# Patient Record
Sex: Female | Born: 1960 | Race: Black or African American | Hispanic: No | Marital: Single | Smoking: Former smoker
Health system: Southern US, Community
[De-identification: ages and names within clinical notes are randomized; demographics above are authoritative.]

## PROBLEM LIST (undated history)

## (undated) DIAGNOSIS — K219 Gastro-esophageal reflux disease without esophagitis: Secondary | ICD-10-CM

---

## 2011-07-23 DIAGNOSIS — K219 Gastro-esophageal reflux disease without esophagitis: Secondary | ICD-10-CM

## 2011-07-23 HISTORY — DX: Gastro-esophageal reflux disease without esophagitis: K21.9

## 2011-10-21 DIAGNOSIS — T23269A Burn of second degree of back of unspecified hand, initial encounter: Secondary | ICD-10-CM | POA: Insufficient documentation

## 2013-01-12 DIAGNOSIS — R1084 Generalized abdominal pain: Secondary | ICD-10-CM | POA: Insufficient documentation

## 2013-01-12 DIAGNOSIS — R1310 Dysphagia, unspecified: Secondary | ICD-10-CM | POA: Insufficient documentation

## 2015-01-17 ENCOUNTER — Emergency Department: Payer: No Typology Code available for payment source

## 2015-01-17 ENCOUNTER — Encounter: Payer: Self-pay | Admitting: Emergency Medicine

## 2015-01-17 ENCOUNTER — Emergency Department
Admission: EM | Admit: 2015-01-17 | Discharge: 2015-01-17 | Disposition: A | Discharge status: Home / Self Care | Payer: No Typology Code available for payment source | Attending: Emergency Medicine | Admitting: Emergency Medicine

## 2015-01-17 DIAGNOSIS — Y998 Other external cause status: Secondary | ICD-10-CM | POA: Diagnosis not present

## 2015-01-17 DIAGNOSIS — S39012A Strain of muscle, fascia and tendon of lower back, initial encounter: Secondary | ICD-10-CM | POA: Insufficient documentation

## 2015-01-17 DIAGNOSIS — Y9389 Activity, other specified: Secondary | ICD-10-CM | POA: Insufficient documentation

## 2015-01-17 DIAGNOSIS — Y9289 Other specified places as the place of occurrence of the external cause: Secondary | ICD-10-CM | POA: Insufficient documentation

## 2015-01-17 DIAGNOSIS — S3992XA Unspecified injury of lower back, initial encounter: Secondary | ICD-10-CM | POA: Diagnosis present

## 2015-01-17 DIAGNOSIS — X58XXXA Exposure to other specified factors, initial encounter: Secondary | ICD-10-CM | POA: Diagnosis not present

## 2015-01-17 MED ORDER — IBUPROFEN 800 MG PO TABS: 800.0000 mg | ORAL_TABLET | Freq: Three times a day (TID) | ORAL | Status: DC | PRN

## 2015-01-17 MED ORDER — TRAMADOL HCL 50 MG PO TABS: 50.0000 mg | ORAL_TABLET | Freq: Four times a day (QID) | ORAL | Status: DC | PRN

## 2015-01-17 MED ORDER — CYCLOBENZAPRINE HCL 10 MG PO TABS: 10.0000 mg | ORAL_TABLET | Freq: Three times a day (TID) | ORAL | Status: DC | PRN

## 2015-01-17 MED ORDER — HYDROMORPHONE HCL 1 MG/ML IJ SOLN
1.0000 mg | Freq: Once | INTRAMUSCULAR | Status: AC
  Administered 2015-01-17: 1 mg via INTRAMUSCULAR
  Filled 2015-01-17: qty 1

## 2015-01-17 MED ORDER — ORPHENADRINE CITRATE 30 MG/ML IJ SOLN
60.0000 mg | Freq: Two times a day (BID) | INTRAMUSCULAR | Status: DC
  Administered 2015-01-17: 60 mg via INTRAMUSCULAR
  Filled 2015-01-17: qty 2

## 2015-01-17 MED ORDER — KETOROLAC TROMETHAMINE 60 MG/2ML IM SOLN
60.0000 mg | Freq: Once | INTRAMUSCULAR | Status: AC
  Administered 2015-01-17: 60 mg via INTRAMUSCULAR
  Filled 2015-01-17: qty 2

## 2015-01-17 NOTE — ED Notes (Signed)
Patient back from  X-ray 

## 2015-01-17 NOTE — ED Notes (Signed)
  Patient present to ED with complaint of lower back pain, reports pain began last night but had an increase in pain this today. Patient reports was moving things yesterday and believes pain might have come from that. Reports took a "muscle relaxer" is not sure of the name of medicine, without relief. Patient appears to be uncomfortable in bed. Patient reports feeling intermittent numbness in legs. Patient alert and oriented x 4, respirations even and unlabored. Family at bedside. Call bell within reach.

## 2015-01-17 NOTE — ED Provider Notes (Signed)
[facility] Emergency Department Provider Note  ____________________________________________  Time seen: Approximately 8:25 PM  I have reviewed the triage vital signs and the nursing notes.   HISTORY  Chief Complaint Back Pain    HPI Renee Gray is a 54 y.o. female complaining of low back pain secondary to extensive movement and lifting of furniture. Patient stated pain began last night has increased throughout the day. Patient states she took unknown muscle relaxer and noticed no relief. Patient stated intermitting numbness in her lower legs. Patient denies any bladder or bowel dysfunction.Patient is rating her pain as a 10 over 10. Patient has seen to be indicated because a Lane in the bed.   History reviewed. No pertinent past medical history.  There are no active problems to display for this patient.   History reviewed. No pertinent past surgical history.  Current Outpatient Rx  Name  Route  Sig  Dispense  Refill  . cyclobenzaprine (FLEXERIL) 10 MG tablet   Oral   Take 1 tablet (10 mg total) by mouth every 8 (eight) hours as needed for muscle spasms.   15 tablet   0   . ibuprofen (ADVIL,MOTRIN) 800 MG tablet   Oral   Take 1 tablet (800 mg total) by mouth every 8 (eight) hours as needed for moderate pain.   15 tablet   0   . traMADol (ULTRAM) 50 MG tablet   Oral   Take 1 tablet (50 mg total) by mouth every 6 (six) hours as needed for moderate pain.   12 tablet   0     Allergies Review of patient's allergies indicates no known allergies.  No family history on file.  Social History History  Substance Use Topics  . Smoking status: Never Smoker   . Smokeless tobacco: Not on file  . Alcohol Use: No    Review of Systems Constitutional: No fever/chills Eyes: No visual changes. ENT: No sore throat. Cardiovascular: Denies chest pain. Respiratory: Denies shortness of breath. Gastrointestinal: No abdominal pain.  No nausea, no  vomiting.  No diarrhea.  No constipation. Genitourinary: Negative for dysuria. Musculoskeletal: Positive for back pain. Skin: Negative for rash. Neurological: Negative for headaches, focal weakness or numbness.  10-point ROS otherwise negative.  ____________________________________________   PHYSICAL EXAM:  VITAL SIGNS: ED Triage Vitals  Enc Vitals Group     BP 01/17/15 2005 112/95 mmHg     Pulse Rate 01/17/15 2003 91     Resp 01/17/15 2003 22     Temp 01/17/15 2005 97.4 F (36.3 C)     Temp Source 01/17/15 2003 Oral     SpO2 01/17/15 2005 96 %     Weight 01/17/15 2003 250 lb (113.399 kg)     Height 01/17/15 2003 5\' 7"  (1.702 m)     Head Cir --      Peak Flow --      Pain Score 01/17/15 2003 10     Pain Loc --      Pain Edu? --      Excl. in GC? --     Constitutional: Alert and oriented. Well appearing and in no acute distress. Eyes: Conjunctivae are normal. PERRL. EOMI. Head: Atraumatic. Nose: No congestion/rhinnorhea. Mouth/Throat: Mucous membranes are moist.  Oropharynx non-erythematous. Neck: No stridor. No cervical spine tenderness to palpation. Hematological/Lymphatic/Immunilogical: No cervical lymphadenopathy. Cardiovascular: Normal rate, regular rhythm. Grossly normal heart sounds.  Good peripheral circulation. Respiratory: Normal respiratory effort.  No retractions. Lungs CTAB. Gastrointestinal: Soft and nontender.  No distention. No abdominal bruits. No CVA tenderness. Musculoskeletal: No obvious deformity of the low back decreased range of motion all fields. Patient has bilateral paraspinal muscle spasm with movement. Patient has some moderate guarding palpation L3-L5. Patient negative straight leg test. Neurologic:  Normal speech and language. No gross focal neurologic deficits are appreciated. No gait instability. Skin:  Skin is warm, dry and intact. No rash noted. Psychiatric: Mood and affect are normal. Speech and behavior are  normal.  ____________________________________________   LABS (all labs ordered are listed, but only abnormal results are displayed)  Labs Reviewed - No data to display ____________________________________________  EKG  ____________________________________________  RADIOLOGY  No acute findings on x-ray of the lumbar spine. I, Joni Reining, personally viewed and evaluated these images as part of my medical decision making.   ____________________________________________   PROCEDURES  Procedure(s) performed: None  Critical Care performed: No  ____________________________________________   INITIAL IMPRESSION / ASSESSMENT AND PLAN / ED COURSE  Pertinent labs & imaging results that were available during my care of the patient were reviewed by me and considered in my medical decision making (see chart for details). Acute low back pain. Patient stated moderate relief status post Dilaudid, Norflex, and Toradol. Patient given a prescription for tramadol Flexeril and ibuprofen. Patient [facility] or return back to the ER if condition worsens. ____________________________________________   FINAL CLINICAL IMPRESSION(S) / ED DIAGNOSES  Final diagnoses:  Lumbar strain, initial encounter      Joni Reining, PA-C 01/17/15 2236  Sharman Cheek, MD 01/19/15 [phone]

## 2015-01-17 NOTE — ED Notes (Signed)
Patient transported to X-ray 

## 2015-01-17 NOTE — ED Notes (Signed)
Pt reports "severe back pain" x3 days, denies any injury.

## 2015-12-01 ENCOUNTER — Emergency Department
Admission: EM | Admit: 2015-12-01 | Discharge: 2015-12-01 | Disposition: A | Discharge status: Home / Self Care | Payer: No Typology Code available for payment source | Attending: Emergency Medicine | Admitting: Emergency Medicine

## 2015-12-01 ENCOUNTER — Encounter: Payer: Self-pay | Admitting: Emergency Medicine

## 2015-12-01 DIAGNOSIS — K219 Gastro-esophageal reflux disease without esophagitis: Secondary | ICD-10-CM | POA: Insufficient documentation

## 2015-12-01 DIAGNOSIS — N390 Urinary tract infection, site not specified: Secondary | ICD-10-CM | POA: Insufficient documentation

## 2015-12-01 HISTORY — DX: Gastro-esophageal reflux disease without esophagitis: K21.9

## 2015-12-01 LAB — URINALYSIS COMPLETE WITH MICROSCOPIC (ARMC ONLY)
BILIRUBIN URINE: NEGATIVE
GLUCOSE, UA: NEGATIVE mg/dL
HGB URINE DIPSTICK: NEGATIVE
KETONES UR: NEGATIVE mg/dL
Leukocytes, UA: NEGATIVE
NITRITE: POSITIVE — AB
Protein, ur: NEGATIVE mg/dL
Specific Gravity, Urine: 1.016 (ref 1.005–1.030)
pH: 6 (ref 5.0–8.0)

## 2015-12-01 MED ORDER — SULFAMETHOXAZOLE-TRIMETHOPRIM 800-160 MG PO TABS: 1.0000 | ORAL_TABLET | Freq: Two times a day (BID) | ORAL | Status: DC

## 2015-12-01 MED ORDER — PHENAZOPYRIDINE HCL 100 MG PO TABS: 100.0000 mg | ORAL_TABLET | Freq: Three times a day (TID) | ORAL | Status: DC | PRN

## 2015-12-01 MED ORDER — RANITIDINE HCL 150 MG PO TABS: 150.0000 mg | ORAL_TABLET | Freq: Every day | ORAL | Status: DC

## 2015-12-01 NOTE — Discharge Instructions (Signed)

## 2015-12-01 NOTE — ED Provider Notes (Signed)
[facility] Emergency Department Provider Note  ____________________________________________  Time seen: Approximately 8:22 AM  I have reviewed the triage vital signs and the nursing notes.   HISTORY  Chief Complaint Back Pain    HPI Renee Gray is a 55 y.o. female , NAD, presents to the emergency department with several week history of dysuria and increased urinary frequency. Patient states over the last 4 days she's had some lower back pain which she believes is related to urinary tract infection. States she has had UTIs in the past but typically begin with dysuria and urinary frequency and as it worsens starts having lower back pain. Has not taken anything over-the-counter for current symptoms. Has not had any fevers, chills, body aches. Does have some lower abdominal pressure but no overt abdominal pain. Denies any pain in the back radiating to her abdomen. Has not had any chest pain, shortness breath, numbness, weakness, tingling. Denies saddle paresthesias. Also requests medication to treat her GERD. States she's been taking Nexium over-the-counter without any significant relief.   Past Medical History  Diagnosis Date  . GERD (gastroesophageal reflux disease)     There are no active problems to display for this patient.   History reviewed. No pertinent past surgical history.  Current Outpatient Rx  Name  Route  Sig  Dispense  Refill  . phenazopyridine (PYRIDIUM) 100 MG tablet   Oral   Take 1 tablet (100 mg total) by mouth 3 (three) times daily as needed for pain (May take 1-2 as needed three times daily).   20 tablet   0   . ranitidine (ZANTAC) 150 MG tablet   Oral   Take 1 tablet (150 mg total) by mouth at bedtime.   30 tablet   0   . sulfamethoxazole-trimethoprim (BACTRIM DS,SEPTRA DS) 800-160 MG tablet   Oral   Take 1 tablet by mouth 2 (two) times daily.   14 tablet   0     Allergies Review of patient's allergies indicates no  known allergies.  No family history on file.  Social History Social History  Substance Use Topics  . Smoking status: Never Smoker   . Smokeless tobacco: None  . Alcohol Use: No     Review of Systems  Constitutional: No fever/chills, Fatigue Eyes: No visual changes.  Cardiovascular: No chest pain, palpitations. Respiratory:  No shortness of breath. No wheezing.  Gastrointestinal: Positive lower abdominal pressure but no other abdominal pain.  No nausea, vomiting.  No diarrhea.  No constipation. Genitourinary: Positive for dysuria, increased urinary frequency. No hematuria. No urinary hesitancy, urgency. Musculoskeletal: Positive for lower back pain.  Skin: Negative for rash. Neurological: Negative for headaches, focal weakness or numbness. No saddle paresthesias 10-point ROS otherwise negative.  ____________________________________________   PHYSICAL EXAM:  VITAL SIGNS: ED Triage Vitals  Enc Vitals Group     BP 12/01/15 0758 141/87 mmHg     Pulse Rate 12/01/15 0758 74     Resp 12/01/15 0758 16     Temp 12/01/15 0758 98.2 F (36.8 C)     Temp Source 12/01/15 0758 Oral     SpO2 12/01/15 0758 94 %     Weight 12/01/15 0758 240 lb (108.863 kg)     Height 12/01/15 0758 5\' 7"  (1.702 m)     Head Cir --      Peak Flow --      Pain Score 12/01/15 0759 10     Pain Loc --  Pain Edu? --      Excl. in GC? --      Constitutional: Alert and oriented. Well appearing and in no acute distress. Eyes: Conjunctivae are normal.  Head: Atraumatic. Neck: Supple with full range of motion. Hematological/Lymphatic/Immunilogical: No cervical lymphadenopathy. Cardiovascular: Normal rate, regular rhythm. Normal S1 and S2.  Good peripheral circulation. Respiratory: Normal respiratory effort without tachypnea or retractions. Lungs CTAB with breath sounds noted in all lung fields. Gastrointestinal: Mild suprapubic tenderness but area is soft without distention or guarding. All other  quadrants are soft and nontender without distention or guarding. No CVA tenderness. Musculoskeletal: No lower extremity tenderness nor edema.  No joint effusions. Neurologic:  Normal speech and language. No gross focal neurologic deficits are appreciated. Gait and posture are normal Skin:  Skin is warm, dry and intact. No rash noted. Psychiatric: Mood and affect are normal. Speech and behavior are normal. Patient exhibits appropriate insight and judgement.   ____________________________________________   LABS (all labs ordered are listed, but only abnormal results are displayed)  Labs Reviewed  URINALYSIS COMPLETEWITH MICROSCOPIC ([facility] ONLY) - Abnormal; Notable for the following:    Color, Urine YELLOW (*)    APPearance CLOUDY (*)    Nitrite POSITIVE (*)    Bacteria, UA MANY (*)    Squamous Epithelial / LPF 0-5 (*)    All other components within normal limits   ____________________________________________  EKG  None ____________________________________________  RADIOLOGY  None ____________________________________________    PROCEDURES  Procedure(s) performed: None   Medications - No data to display   ____________________________________________   INITIAL IMPRESSION / ASSESSMENT AND PLAN / ED COURSE  Pertinent lab results that were available during my care of the patient were reviewed by me and considered in my medical decision making (see chart for details).  Patient's diagnosis is consistent with lower urinary tract infection and gastroesophageal reflux disease. Patient will be discharged home with prescriptions for Bactrim DS, Zantac, Pyridium to take as directed. Patient is to follow up with one of the local outpatient community clinics as listed in discharge instructions if symptoms persist past this treatment course. Patient is given ED precautions to return to the ED for any worsening or new symptoms.     ____________________________________________  FINAL CLINICAL IMPRESSION(S) / ED DIAGNOSES  Final diagnoses:  Lower urinary tract infection  Gastroesophageal reflux disease without esophagitis      NEW MEDICATIONS STARTED DURING THIS VISIT:  New Prescriptions   PHENAZOPYRIDINE (PYRIDIUM) 100 MG TABLET    Take 1 tablet (100 mg total) by mouth 3 (three) times daily as needed for pain (May take 1-2 as needed three times daily).   RANITIDINE (ZANTAC) 150 MG TABLET    Take 1 tablet (150 mg total) by mouth at bedtime.   SULFAMETHOXAZOLE-TRIMETHOPRIM (BACTRIM DS,SEPTRA DS) 800-160 MG TABLET    Take 1 tablet by mouth 2 (two) times daily.         Hope Pigeon, PA-C 12/01/15 1610  Sharyn Creamer, MD 12/01/15 [phone]

## 2015-12-01 NOTE — ED Notes (Signed)
States she is having lower back pain for couple of days   With pain with urination and bad odor

## 2015-12-01 NOTE — ED Notes (Signed)
Pt reports lower back pain x4 days, states "I probably got a UTI because my back always hurts like this"; pt denies any urinary symptoms.

## 2016-02-12 ENCOUNTER — Emergency Department
Admission: EM | Admit: 2016-02-12 | Discharge: 2016-02-12 | Disposition: A | Discharge status: Home / Self Care | Payer: BLUE CROSS/BLUE SHIELD | Attending: Emergency Medicine | Admitting: Emergency Medicine

## 2016-02-12 ENCOUNTER — Encounter: Payer: Self-pay | Admitting: Emergency Medicine

## 2016-02-12 DIAGNOSIS — R112 Nausea with vomiting, unspecified: Secondary | ICD-10-CM | POA: Diagnosis present

## 2016-02-12 DIAGNOSIS — Z5321 Procedure and treatment not carried out due to patient leaving prior to being seen by health care provider: Secondary | ICD-10-CM | POA: Diagnosis not present

## 2016-02-12 LAB — COMPREHENSIVE METABOLIC PANEL
ALBUMIN: 4.2 g/dL (ref 3.5–5.0)
ALT: 17 U/L (ref 14–54)
AST: 22 U/L (ref 15–41)
Alkaline Phosphatase: 104 U/L (ref 38–126)
Anion gap: 4 — ABNORMAL LOW (ref 5–15)
BUN: 15 mg/dL (ref 6–20)
CO2: 32 mmol/L (ref 22–32)
Calcium: 9.4 mg/dL (ref 8.9–10.3)
Chloride: 107 mmol/L (ref 101–111)
Creatinine, Ser: 0.84 mg/dL (ref 0.44–1.00)
GFR calc Af Amer: >60 mL/min (ref >60)
GFR calc non Af Amer: >60 mL/min (ref >60)
GLUCOSE: 100 mg/dL — AB (ref 65–99)
POTASSIUM: 3.5 mmol/L (ref 3.5–5.1)
Sodium: 143 mmol/L (ref 135–145)
TOTAL PROTEIN: 8.1 g/dL (ref 6.5–8.1)
Total Bilirubin: 0.4 mg/dL (ref 0.3–1.2)

## 2016-02-12 LAB — URINALYSIS COMPLETE WITH MICROSCOPIC (ARMC ONLY)
BILIRUBIN URINE: NEGATIVE
Glucose, UA: NEGATIVE mg/dL
Hgb urine dipstick: NEGATIVE
Ketones, ur: NEGATIVE mg/dL
NITRITE: POSITIVE — AB
Protein, ur: 30 mg/dL — AB
Specific Gravity, Urine: 1.015 (ref 1.005–1.030)
pH: 9 — ABNORMAL HIGH (ref 5.0–8.0)

## 2016-02-12 LAB — LIPASE, BLOOD: Lipase: 61 U/L — ABNORMAL HIGH (ref 11–51)

## 2016-02-12 LAB — CBC
HEMATOCRIT: 27.7 % — AB (ref 35.0–47.0)
Hemoglobin: 8.7 g/dL — ABNORMAL LOW (ref 12.0–16.0)
MCH: 23.2 pg — AB (ref 26.0–34.0)
MCHC: 31.3 g/dL — AB (ref 32.0–36.0)
MCV: 74 fL — ABNORMAL LOW (ref 80.0–100.0)
Platelets: 277 10*3/uL (ref 150–440)
RBC: 3.75 MIL/uL — ABNORMAL LOW (ref 3.80–5.20)
RDW: 19.2 % — AB (ref 11.5–14.5)
WBC: 8.3 10*3/uL (ref 3.6–11.0)

## 2016-02-12 MED ORDER — ONDANSETRON 4 MG PO TBDP
4.0000 mg | ORAL_TABLET | Freq: Once | ORAL | Status: AC | PRN
  Administered 2016-02-12: 4 mg via ORAL

## 2016-02-12 MED ORDER — ONDANSETRON 4 MG PO TBDP
ORAL_TABLET | ORAL | Status: AC
  Filled 2016-02-12: qty 1

## 2016-02-12 NOTE — ED Notes (Signed)
No answer when called from lobby and outside 

## 2016-02-12 NOTE — ED Triage Notes (Signed)
Pt comes in to ED w/ c/o abdominal pain, Nausea,vomitting x2days. Pt states cannot hold anything down. Pt AOx4.

## 2016-02-12 NOTE — ED Notes (Signed)
Lab called regarding blood draw sent at 7pm, results not received yet; st doing now

## 2016-02-12 NOTE — ED Notes (Signed)
No answer when called several times from lobby 

## 2016-05-29 ENCOUNTER — Encounter: Payer: Self-pay | Admitting: Emergency Medicine

## 2016-05-29 ENCOUNTER — Emergency Department
Admission: EM | Admit: 2016-05-29 | Discharge: 2016-05-29 | Disposition: A | Discharge status: Home / Self Care | Payer: BLUE CROSS/BLUE SHIELD | Attending: Emergency Medicine | Admitting: Emergency Medicine

## 2016-05-29 DIAGNOSIS — K219 Gastro-esophageal reflux disease without esophagitis: Secondary | ICD-10-CM

## 2016-05-29 DIAGNOSIS — N390 Urinary tract infection, site not specified: Secondary | ICD-10-CM

## 2016-05-29 DIAGNOSIS — R1030 Lower abdominal pain, unspecified: Secondary | ICD-10-CM | POA: Diagnosis present

## 2016-05-29 LAB — CBC
HEMATOCRIT: 29.8 % — AB (ref 35.0–47.0)
HEMOGLOBIN: 9.5 g/dL — AB (ref 12.0–16.0)
MCH: 24.6 pg — AB (ref 26.0–34.0)
MCHC: 31.9 g/dL — AB (ref 32.0–36.0)
MCV: 77.1 fL — ABNORMAL LOW (ref 80.0–100.0)
Platelets: 236 10*3/uL (ref 150–440)
RBC: 3.87 MIL/uL (ref 3.80–5.20)
RDW: 20.3 % — ABNORMAL HIGH (ref 11.5–14.5)
WBC: 6.5 10*3/uL (ref 3.6–11.0)

## 2016-05-29 LAB — COMPREHENSIVE METABOLIC PANEL
ALBUMIN: 3.9 g/dL (ref 3.5–5.0)
ALT: 19 U/L (ref 14–54)
ANION GAP: 7 (ref 5–15)
AST: 23 U/L (ref 15–41)
Alkaline Phosphatase: 95 U/L (ref 38–126)
BUN: 15 mg/dL (ref 6–20)
CO2: 27 mmol/L (ref 22–32)
Calcium: 9.2 mg/dL (ref 8.9–10.3)
Chloride: 106 mmol/L (ref 101–111)
Creatinine, Ser: 0.75 mg/dL (ref 0.44–1.00)
GFR calc Af Amer: >60 mL/min (ref >60)
GFR calc non Af Amer: >60 mL/min (ref >60)
GLUCOSE: 101 mg/dL — AB (ref 65–99)
POTASSIUM: 3.5 mmol/L (ref 3.5–5.1)
Sodium: 140 mmol/L (ref 135–145)
Total Bilirubin: 0.7 mg/dL (ref 0.3–1.2)
Total Protein: 7.6 g/dL (ref 6.5–8.1)

## 2016-05-29 LAB — URINALYSIS, COMPLETE (UACMP) WITH MICROSCOPIC
Bilirubin Urine: NEGATIVE
GLUCOSE, UA: NEGATIVE mg/dL
Hgb urine dipstick: NEGATIVE
KETONES UR: NEGATIVE mg/dL
Nitrite: POSITIVE — AB
Protein, ur: NEGATIVE mg/dL
Specific Gravity, Urine: 1.023 (ref 1.005–1.030)
pH: 7 (ref 5.0–8.0)

## 2016-05-29 LAB — LIPASE, BLOOD: Lipase: 35 U/L (ref 11–51)

## 2016-05-29 MED ORDER — ONDANSETRON 4 MG PO TBDP: 4.0000 mg | ORAL_TABLET | Freq: Three times a day (TID) | ORAL | 0 refills | Status: DC | PRN

## 2016-05-29 MED ORDER — SULFAMETHOXAZOLE-TRIMETHOPRIM 800-160 MG PO TABS
1.0000 | ORAL_TABLET | Freq: Once | ORAL | Status: AC
  Administered 2016-05-29: 1 via ORAL
  Filled 2016-05-29: qty 1

## 2016-05-29 MED ORDER — PANTOPRAZOLE SODIUM 40 MG PO TBEC: 40.0000 mg | DELAYED_RELEASE_TABLET | Freq: Every day | ORAL | 0 refills | Status: DC

## 2016-05-29 MED ORDER — SULFAMETHOXAZOLE-TRIMETHOPRIM 800-160 MG PO TABS: 1.0000 | ORAL_TABLET | Freq: Two times a day (BID) | ORAL | 0 refills | Status: DC

## 2016-05-29 MED ORDER — ONDANSETRON 4 MG PO TBDP
4.0000 mg | ORAL_TABLET | Freq: Once | ORAL | Status: AC | PRN
Start: 2016-05-29 — End: 2016-05-29
  Administered 2016-05-29: 4 mg via ORAL
  Filled 2016-05-29: qty 1

## 2016-05-29 MED ORDER — PHENAZOPYRIDINE HCL 200 MG PO TABS: 200.0000 mg | ORAL_TABLET | Freq: Three times a day (TID) | ORAL | 0 refills | Status: DC | PRN

## 2016-05-29 NOTE — ED Triage Notes (Signed)
Pt ambulatory to triage in nad, reports lower abd pain n/v x 1 week.  Pt reports hx GERD but out of medication.

## 2016-05-29 NOTE — Discharge Instructions (Signed)
1. Take antibiotic as prescribed (Septra DS twice daily 7 days). 2. You may take nausea medicine as needed (Zofran #20). 3. Take Pyridium as prescribed for urinary discomfort. 4. Start Protonix 40 mg daily to reduce symptoms of acid reflux. 5. Return to the ER for worsening symptoms, persistent vomiting, difficulty breathing or other concerns.

## 2016-05-29 NOTE — ED Provider Notes (Signed)
[facility] Emergency Department Provider Note   ____________________________________________   First MD Initiated Contact with Patient 05/29/16 2319     (approximate)  I have reviewed the triage vital signs and the nursing notes.   HISTORY  Chief Complaint Abdominal Pain    HPI Renee Gray is a 55 y.o. female who presents to the ED from home with a chief complaint of abdominal pain.Patient reports a one-week history of dysuria, suprapubic abdominal pain, occasional nausea and vomiting. Also reports she is out of medication for her GERD. Reports similar symptoms previously. Denies associated fever, chills, chest pain, shortness of breath, diarrhea. Denies recent travel or trauma. Nothing makes her symptoms better or worse.   Past Medical History:  Diagnosis Date  . GERD (gastroesophageal reflux disease)     There are no active problems to display for this patient.   History reviewed. No pertinent surgical history.  Prior to Admission medications   Medication Sig Start Date End Date Taking? Authorizing Provider  phenazopyridine (PYRIDIUM) 100 MG tablet Take 1 tablet (100 mg total) by mouth 3 (three) times daily as needed for pain (May take 1-2 as needed three times daily). 12/01/15   Jami L Hagler, PA-C  ranitidine (ZANTAC) 150 MG tablet Take 1 tablet (150 mg total) by mouth at bedtime. 12/01/15 12/31/15  Jami L Hagler, PA-C  sulfamethoxazole-trimethoprim (BACTRIM DS,SEPTRA DS) 800-160 MG tablet Take 1 tablet by mouth 2 (two) times daily. 12/01/15   Jami L Hagler, PA-C    Allergies Patient has no known allergies.  History reviewed. No pertinent family history.  Social History Social History  Substance Use Topics  . Smoking status: Never Smoker  . Smokeless tobacco: Never Used  . Alcohol use No    Review of Systems  Constitutional: No fever/chills. Eyes: No visual changes. ENT: No sore throat. Cardiovascular: Denies chest  pain. Respiratory: Denies shortness of breath. Gastrointestinal: Positive for abdominal pain.  Positive for occasional nausea and vomiting.  No diarrhea.  No constipation. Genitourinary: Positive for dysuria. Musculoskeletal: Negative for back pain. Skin: Negative for rash. Neurological: Negative for headaches, focal weakness or numbness.  10-point ROS otherwise negative.  ____________________________________________   PHYSICAL EXAM:  VITAL SIGNS: ED Triage Vitals  Enc Vitals Group     BP 05/29/16 2037 (!) 144/84     Pulse Rate 05/29/16 2037 85     Resp 05/29/16 2037 18     Temp 05/29/16 2037 97.6 F (36.4 C)     Temp Source 05/29/16 2037 Oral     SpO2 05/29/16 2037 95 %     Weight 05/29/16 2037 240 lb (108.9 kg)     Height 05/29/16 2037 5\' 7"  (1.702 m)     Head Circumference --      Peak Flow --      Pain Score 05/29/16 2038 10     Pain Loc --      Pain Edu? --      Excl. in GC? --     Constitutional: Alert and oriented. Well appearing and in no acute distress. Eyes: Conjunctivae are normal. PERRL. EOMI. Head: Atraumatic. Nose: No congestion/rhinnorhea. Mouth/Throat: Mucous membranes are moist.  Oropharynx non-erythematous. Neck: No stridor.   Cardiovascular: Normal rate, regular rhythm. Grossly normal heart sounds.  Good peripheral circulation. Respiratory: Normal respiratory effort.  No retractions. Lungs CTAB. Gastrointestinal: Obese. Soft and mildly tender to palpation suprapubic area without rebound or guarding. No distention. No abdominal bruits. No CVA tenderness. Musculoskeletal: No lower extremity  tenderness nor edema.  No joint effusions. Neurologic:  Normal speech and language. No gross focal neurologic deficits are appreciated. No gait instability. Skin:  Skin is warm, dry and intact. No rash noted. Psychiatric: Mood and affect are normal. Speech and behavior are normal.  ____________________________________________   LABS (all labs ordered are listed,  but only abnormal results are displayed)  Labs Reviewed  COMPREHENSIVE METABOLIC PANEL - Abnormal; Notable for the following:       Result Value   Glucose, Bld 101 (*)    All other components within normal limits  CBC - Abnormal; Notable for the following:    Hemoglobin 9.5 (*)    HCT 29.8 (*)    MCV 77.1 (*)    MCH 24.6 (*)    MCHC 31.9 (*)    RDW 20.3 (*)    All other components within normal limits  URINALYSIS, COMPLETE (UACMP) WITH MICROSCOPIC - Abnormal; Notable for the following:    Color, Urine YELLOW (*)    APPearance HAZY (*)    Nitrite POSITIVE (*)    Leukocytes, UA SMALL (*)    Bacteria, UA MANY (*)    Squamous Epithelial / LPF 6-30 (*)    All other components within normal limits  LIPASE, BLOOD   ____________________________________________  EKG  None  ____________________________________________  RADIOLOGY  None ____________________________________________   PROCEDURES  Procedure(s) performed: None  Procedures  Critical Care performed: No  ____________________________________________   INITIAL IMPRESSION / ASSESSMENT AND PLAN / ED COURSE  Pertinent labs & imaging results that were available during my care of the patient were reviewed by me and considered in my medical decision making (see chart for details).  55 year old female who presents with lower abdominal pain, dysuria, occasional nausea/vomiting. Received Zofran in triage and feels much better. Tolerated PO without emesis. Laboratory results demonstrate stable anemia; urinalysis suggestive of UTI. Will treat with antibiotic, Pyridium, antiemetic as needed. Will refill GERD medication patient will follow-up with her PCP next week. Strict return precautions given. Patient verbalizes understanding and agrees with plan of care.  Clinical Course      ____________________________________________   FINAL CLINICAL IMPRESSION(S) / ED DIAGNOSES  Final diagnoses:  Lower abdominal pain  Lower  urinary tract infectious disease      NEW MEDICATIONS STARTED DURING THIS VISIT:  New Prescriptions   No medications on file     Note:  This document was prepared using Dragon voice recognition software and may include unintentional dictation errors.    Irean HongJade J Ronan Dion, MD 05/30/16 [phone]

## 2016-09-30 DIAGNOSIS — Z0000 Encounter for general adult medical examination without abnormal findings: Secondary | ICD-10-CM | POA: Insufficient documentation

## 2016-09-30 DIAGNOSIS — G8929 Other chronic pain: Secondary | ICD-10-CM | POA: Insufficient documentation

## 2016-09-30 DIAGNOSIS — R7303 Prediabetes: Secondary | ICD-10-CM | POA: Insufficient documentation

## 2016-09-30 DIAGNOSIS — R519 Headache, unspecified: Secondary | ICD-10-CM

## 2016-09-30 DIAGNOSIS — Z1239 Encounter for other screening for malignant neoplasm of breast: Secondary | ICD-10-CM | POA: Insufficient documentation

## 2016-09-30 HISTORY — DX: Headache, unspecified: R51.9

## 2016-09-30 HISTORY — DX: Encounter for general adult medical examination without abnormal findings: Z00.00

## 2016-10-05 DIAGNOSIS — R140 Abdominal distension (gaseous): Secondary | ICD-10-CM | POA: Insufficient documentation

## 2016-10-05 DIAGNOSIS — R112 Nausea with vomiting, unspecified: Secondary | ICD-10-CM

## 2016-10-05 DIAGNOSIS — K5901 Slow transit constipation: Secondary | ICD-10-CM | POA: Insufficient documentation

## 2016-10-05 HISTORY — DX: Abdominal distension (gaseous): R14.0

## 2016-10-05 HISTORY — DX: Nausea with vomiting, unspecified: R11.2

## 2016-10-28 ENCOUNTER — Emergency Department
Admission: EM | Admit: 2016-10-28 | Discharge: 2016-10-28 | Disposition: A | Discharge status: Home / Self Care | Payer: BLUE CROSS/BLUE SHIELD | Attending: Emergency Medicine | Admitting: Emergency Medicine

## 2016-10-28 ENCOUNTER — Encounter: Payer: Self-pay | Admitting: Emergency Medicine

## 2016-10-28 ENCOUNTER — Emergency Department: Payer: BLUE CROSS/BLUE SHIELD

## 2016-10-28 DIAGNOSIS — R109 Unspecified abdominal pain: Secondary | ICD-10-CM | POA: Diagnosis not present

## 2016-10-28 DIAGNOSIS — Z79899 Other long term (current) drug therapy: Secondary | ICD-10-CM | POA: Insufficient documentation

## 2016-10-28 DIAGNOSIS — R300 Dysuria: Secondary | ICD-10-CM | POA: Diagnosis not present

## 2016-10-28 LAB — CBC WITH DIFFERENTIAL/PLATELET
BASOS ABS: 0 10*3/uL (ref 0–0.1)
BASOS PCT: 0 %
EOS ABS: 0.1 10*3/uL (ref 0–0.7)
EOS PCT: 2 %
HCT: 32.7 % — ABNORMAL LOW (ref 35.0–47.0)
Hemoglobin: 10.4 g/dL — ABNORMAL LOW (ref 12.0–16.0)
Lymphocytes Relative: 27 %
Lymphs Abs: 1.6 10*3/uL (ref 1.0–3.6)
MCH: 26.4 pg (ref 26.0–34.0)
MCHC: 31.9 g/dL — AB (ref 32.0–36.0)
MCV: 82.6 fL (ref 80.0–100.0)
MONO ABS: 0.6 10*3/uL (ref 0.2–0.9)
MONOS PCT: 10 %
Neutro Abs: 3.7 10*3/uL (ref 1.4–6.5)
Neutrophils Relative %: 61 %
PLATELETS: 222 10*3/uL (ref 150–440)
RBC: 3.96 MIL/uL (ref 3.80–5.20)
RDW: 16.5 % — AB (ref 11.5–14.5)
WBC: 6 10*3/uL (ref 3.6–11.0)

## 2016-10-28 LAB — BASIC METABOLIC PANEL
Anion gap: 8 (ref 5–15)
BUN: 10 mg/dL (ref 6–20)
CALCIUM: 8.8 mg/dL — AB (ref 8.9–10.3)
CO2: 26 mmol/L (ref 22–32)
CREATININE: 0.74 mg/dL (ref 0.44–1.00)
Chloride: 106 mmol/L (ref 101–111)
GFR calc non Af Amer: >60 mL/min (ref >60)
Glucose, Bld: 83 mg/dL (ref 65–99)
Potassium: 3.3 mmol/L — ABNORMAL LOW (ref 3.5–5.1)
SODIUM: 140 mmol/L (ref 135–145)

## 2016-10-28 LAB — URINALYSIS, ROUTINE W REFLEX MICROSCOPIC
BILIRUBIN URINE: NEGATIVE
Glucose, UA: NEGATIVE mg/dL
Hgb urine dipstick: NEGATIVE
KETONES UR: NEGATIVE mg/dL
Nitrite: NEGATIVE
Protein, ur: NEGATIVE mg/dL
SPECIFIC GRAVITY, URINE: 1.016 (ref 1.005–1.030)
pH: 7 (ref 5.0–8.0)

## 2016-10-28 MED ORDER — MORPHINE SULFATE (PF) 4 MG/ML IV SOLN
4.0000 mg | Freq: Once | INTRAVENOUS | Status: AC
  Administered 2016-10-28: 4 mg via INTRAVENOUS
  Filled 2016-10-28: qty 1

## 2016-10-28 MED ORDER — PHENAZOPYRIDINE HCL 95 MG PO TABS: 190.0000 mg | ORAL_TABLET | Freq: Three times a day (TID) | ORAL | 0 refills | Status: DC | PRN

## 2016-10-28 MED ORDER — SODIUM CHLORIDE 0.9 % IV BOLUS (SEPSIS)
1000.0000 mL | INTRAVENOUS | Status: AC
  Administered 2016-10-28: 1000 mL via INTRAVENOUS

## 2016-10-28 MED ORDER — ONDANSETRON HCL 4 MG/2ML IJ SOLN
4.0000 mg | INTRAMUSCULAR | Status: AC
  Administered 2016-10-28: 4 mg via INTRAVENOUS
  Filled 2016-10-28: qty 2

## 2016-10-28 MED ORDER — KETOROLAC TROMETHAMINE 30 MG/ML IJ SOLN
15.0000 mg | Freq: Once | INTRAMUSCULAR | Status: AC
  Administered 2016-10-28: 15 mg via INTRAVENOUS
  Filled 2016-10-28: qty 1

## 2016-10-28 MED ORDER — CEPHALEXIN 500 MG PO CAPS
500.0000 mg | ORAL_CAPSULE | Freq: Once | ORAL | Status: AC
  Administered 2016-10-28: 500 mg via ORAL
  Filled 2016-10-28: qty 1

## 2016-10-28 MED ORDER — CEPHALEXIN 500 MG PO CAPS: 500.0000 mg | ORAL_CAPSULE | Freq: Two times a day (BID) | ORAL | 0 refills | Status: DC

## 2016-10-28 NOTE — ED Provider Notes (Signed)
[facility] Emergency Department Provider Note  ____________________________________________   First MD Initiated Contact with Patient 10/28/16 1848     (approximate)  I have reviewed the triage vital signs and the nursing notes.   HISTORY  Chief Complaint Flank Pain and Dysuria    HPI Renee Gray is a 56 y.o. female Who denies any chronic medical issues but states she has had a number of bladder infections in the past who presents for evaluation of gradually worsening burning pain when she urinates and right-sided flank pain.  She states that the symptoms started mild but are now severe and worse with any kind of movement and with urination.  Nothing makes the symptoms better.  She denies fever/chills, chest pain, shortness of breath, nausea, vomiting, diarrhea, vaginal bleeding.  She has had kidney stones in the past.  She has had no numbness or tingling in any of her extremities. She has no abdominal pain.   Past Medical History:  Diagnosis Date  . GERD (gastroesophageal reflux disease)     There are no active problems to display for this patient.   No past surgical history on file.  Prior to Admission medications   Medication Sig Start Date End Date Taking? Authorizing Provider  Biotin 5000 MCG TABS Take 5,000 mcg by mouth daily.   Yes [provider]  omeprazole (PRILOSEC) 20 MG capsule Take 20 mg by mouth 2 (two) times daily before a meal.   Yes [provider]  cephALEXin (KEFLEX) 500 MG capsule Take 1 capsule (500 mg total) by mouth 2 (two) times daily. 10/28/16   Loleta Rose, MD  ondansetron (ZOFRAN ODT) 4 MG disintegrating tablet Take 1 tablet (4 mg total) by mouth every 8 (eight) hours as needed for nausea or vomiting. Patient not taking: Reported on 10/28/2016 05/29/16   Irean Hong, MD  pantoprazole (PROTONIX) 40 MG tablet Take 1 tablet (40 mg total) by mouth daily. Patient not taking: Reported on 10/28/2016  05/29/16   Irean Hong, MD  phenazopyridine (PYRIDIUM) 95 MG tablet Take 2 tablets (190 mg total) by mouth 3 (three) times daily with meals as needed for pain (bladder pain/spasms). 10/28/16   Loleta Rose, MD  ranitidine (ZANTAC) 150 MG tablet Take 1 tablet (150 mg total) by mouth at bedtime. Patient not taking: Reported on 10/28/2016 12/01/15 12/31/15  Hagler, Clearnce Hasten L, PA-C    Allergies Patient has no known allergies.  No family history on file.  Social History Social History  Substance Use Topics  . Smoking status: Never Smoker  . Smokeless tobacco: Never Used  . Alcohol use No    Review of Systems Constitutional: No fever/chills Eyes: No visual changes. ENT: No sore throat. Cardiovascular: Denies chest pain. Respiratory: Denies shortness of breath. Gastrointestinal: No abdominal pain.  right-sided flank pain. No nausea, no vomiting.  No diarrhea.  No constipation. Genitourinary: +dysuria. Musculoskeletal: Negative for neck pain.  Negative for back pain. Integumentary: Negative for rash. Neurological: Negative for headaches, focal weakness or numbness.   ____________________________________________   PHYSICAL EXAM:  VITAL SIGNS: ED Triage Vitals  Enc Vitals Group     BP 10/28/16 1808 (!) 162/93     Pulse Rate 10/28/16 1808 94     Resp 10/28/16 1808 18     Temp 10/28/16 1808 98 F (36.7 C)     Temp Source 10/28/16 1808 Oral     SpO2 10/28/16 1808 93 %     Weight 10/28/16 1809 250 lb (113.4  kg)     Height 10/28/16 1809 5\' 7"  (1.702 m)     Head Circumference --      Peak Flow --      Pain Score 10/28/16 1808 10     Pain Loc --      Pain Edu? --      Excl. in GC? --     Constitutional: Alert and oriented. Well appearing and in no acute distress though she does appear uncomfortable Eyes: Conjunctivae are normal.  Head: Atraumatic. Nose: No congestion/rhinnorhea. Mouth/Throat: Mucous membranes are moist. Neck: No stridor.  No meningeal signs.   Cardiovascular:  Normal rate, regular rhythm. Good peripheral circulation. Grossly normal heart sounds. Respiratory: Normal respiratory effort.  No retractions. Lungs CTAB. Gastrointestinal: Soft and nontender. No distention.  +right CVA tenderness Genitourinary: deferred Musculoskeletal: No lower extremity tenderness nor edema. No gross deformities of extremities. Neurologic:  Normal speech and language. No gross focal neurologic deficits are appreciated.  Skin:  Skin is warm, dry and intact. No rash noted. Psychiatric: Mood and affect are normal. Speech and behavior are normal.  ____________________________________________   LABS (all labs ordered are listed, but only abnormal results are displayed)  Labs Reviewed  URINALYSIS, ROUTINE W REFLEX MICROSCOPIC - Abnormal; Notable for the following:       Result Value   Color, Urine YELLOW (*)    APPearance CLOUDY (*)    Leukocytes, UA TRACE (*)    Bacteria, UA RARE (*)    Squamous Epithelial / LPF TOO NUMEROUS TO COUNT (*)    All other components within normal limits  CBC WITH DIFFERENTIAL/PLATELET - Abnormal; Notable for the following:    Hemoglobin 10.4 (*)    HCT 32.7 (*)    MCHC 31.9 (*)    RDW 16.5 (*)    All other components within normal limits  BASIC METABOLIC PANEL - Abnormal; Notable for the following:    Potassium 3.3 (*)    Calcium 8.8 (*)    All other components within normal limits  URINE CULTURE   ____________________________________________  EKG  None - EKG not ordered by ED physician ____________________________________________  RADIOLOGY   Ct Renal Stone Study  Result Date: 10/28/2016 CLINICAL DATA:  Bilateral flank pain worse on the right radiating to the groin. Dysuria. EXAM: CT ABDOMEN AND PELVIS WITHOUT CONTRAST TECHNIQUE: Multidetector CT imaging of the abdomen and pelvis was performed following the standard protocol without IV contrast. COMPARISON:  None. FINDINGS: Lower chest: Moderate sized hiatal hernia with  atelectasis at the lung bases. Normal size cardiac chambers without pericardial effusion. Hepatobiliary: No focal liver abnormality is seen. Status post cholecystectomy. No biliary dilatation. Pancreas: Unremarkable. No pancreatic ductal dilatation or surrounding inflammatory changes. Spleen: Normal in size without focal abnormality. Adrenals/Urinary Tract: Adrenal glands are unremarkable. Kidneys are normal, without renal calculi, focal lesion, or hydronephrosis. Bladder is unremarkable. Stomach/Bowel: Scattered colonic diverticulosis. No bowel obstruction or inflammation. Normal-appearing appendix. Vascular/Lymphatic: No significant vascular findings are present. No enlarged abdominal or pelvic lymph nodes. Reproductive: Uterus and bilateral adnexa are unremarkable. Other: No abdominal wall hernia or abnormality. No abdominopelvic ascites. Musculoskeletal: Mild degenerative disc disease along the visualized thoracolumbar spine from approximately T7 through L1 without acute nor suspicious osseous abnormalities. Minimal anterolisthesis of L4 on L5. Disc space narrowing is also noted at L5-S1. No acute nor suspicious osseous abnormalities. IMPRESSION: 1. No nephrolithiasis or evidence of obstructive uropathy. The bladder is contracted without mural thickening. 2. Scattered colonic diverticulosis without bowel obstruction or inflammation. 3. Status  post cholecystectomy. Electronically Signed   By: Tollie Eth M.D.   On: 10/28/2016 20:44    ____________________________________________   PROCEDURES  Critical Care performed: No   Procedure(s) performed:   Procedures   ____________________________________________   INITIAL IMPRESSION / ASSESSMENT AND PLAN / ED COURSE  Pertinent labs & imaging results that were available during my care of the patient were reviewed by me and considered in my medical decision making (see chart for details).  the patient most likely has a urinary tract infection or  possibly pyelonephritis but she does appear quite uncomfortable.  She feels currently unable to urinate.  I will start an IV and give her IV fluids and check basic labs to look for signs of systemic infection as well as kidney function.  I anticipate I will also obtain a CT scan to make sure she does not have an obstructive/infected stone.   Clinical Course as of Oct 29 116  Wed Oct 28, 2016  2047 reassuring CT renal stone protocol CT Renal Stone Study [CF]  2141 UA minimally indicative of UTI, but will treat empirically.  Sending urine culture.  Updated patient who states she is ready to go home.  I will give first dose keflex here since pharmacy is closed.  [CF]    Clinical Course User Index [CF] Loleta Rose, MD    ____________________________________________  FINAL CLINICAL IMPRESSION(S) / ED DIAGNOSES  Final diagnoses:  Dysuria     MEDICATIONS GIVEN DURING THIS VISIT:  Medications  sodium chloride 0.9 % bolus 1,000 mL (0 mLs Intravenous Stopped 10/28/16 2205)  morphine 4 MG/ML injection 4 mg (4 mg Intravenous Given 10/28/16 1916)  ondansetron (ZOFRAN) injection 4 mg (4 mg Intravenous Given 10/28/16 1916)  cephALEXin (KEFLEX) capsule 500 mg (500 mg Oral Given 10/28/16 2156)  ketorolac (TORADOL) 30 MG/ML injection 15 mg (15 mg Intravenous Given 10/28/16 2156)     NEW OUTPATIENT MEDICATIONS STARTED DURING THIS VISIT:  Discharge Medication List as of 10/28/2016  9:44 PM    START taking these medications   Details  cephALEXin (KEFLEX) 500 MG capsule Take 1 capsule (500 mg total) by mouth 2 (two) times daily., Starting Wed 10/28/2016, Print        Discharge Medication List as of 10/28/2016  9:44 PM    CONTINUE these medications which have CHANGED   Details  phenazopyridine (PYRIDIUM) 95 MG tablet Take 2 tablets (190 mg total) by mouth 3 (three) times daily with meals as needed for pain (bladder pain/spasms)., Starting Wed 10/28/2016, Print        Discharge Medication List  as of 10/28/2016  9:44 PM       Note:  This document was prepared using Dragon voice recognition software and may include unintentional dictation errors.    Loleta Rose, MD 10/29/16 [phone]

## 2016-10-28 NOTE — ED Notes (Signed)
Pt unable to void at this time. 

## 2016-10-28 NOTE — Discharge Instructions (Signed)

## 2016-10-28 NOTE — ED Notes (Signed)
Patient transported to CT 

## 2016-10-28 NOTE — ED Notes (Signed)
Pt has n/v   meds given   md aware

## 2016-10-28 NOTE — ED Notes (Addendum)
pt has right side back pain.  No abd pain.  Pt reports dysuria.  No n/v/d.  Sx for 2 days.  Hx kidney stones. No vag bleeding.  Pt reports vag discharge.   Pt alert.  Speech clear.

## 2016-10-28 NOTE — ED Triage Notes (Signed)
Pt reports bilateral flank pain, worse on the right that radiates to groin. Pt denies NVD. Pt appears uncomfortable in triage. Pt reports burning upon urination.

## 2016-10-30 LAB — URINE CULTURE: SPECIAL REQUESTS: NORMAL

## 2016-11-11 DIAGNOSIS — D649 Anemia, unspecified: Secondary | ICD-10-CM | POA: Insufficient documentation

## 2017-04-19 ENCOUNTER — Emergency Department
Admission: EM | Admit: 2017-04-19 | Discharge: 2017-04-19 | Disposition: A | Discharge status: Home / Self Care | Payer: BLUE CROSS/BLUE SHIELD | Attending: Emergency Medicine | Admitting: Emergency Medicine

## 2017-04-19 ENCOUNTER — Encounter: Payer: Self-pay | Admitting: Emergency Medicine

## 2017-04-19 ENCOUNTER — Other Ambulatory Visit: Payer: Self-pay

## 2017-04-19 DIAGNOSIS — Z5321 Procedure and treatment not carried out due to patient leaving prior to being seen by health care provider: Secondary | ICD-10-CM | POA: Insufficient documentation

## 2017-04-19 DIAGNOSIS — M545 Low back pain: Secondary | ICD-10-CM | POA: Diagnosis present

## 2017-04-19 NOTE — ED Provider Notes (Cosign Needed)
Patient was triaged, placed in room.  Patient eloped prior to seeing provider.  She left without informing staff or provider.  No assessment or treatment provided.   Racheal PatchesCuthriell, [place] Sigmund D, PA-C 04/19/17 1607

## 2017-04-19 NOTE — ED Notes (Signed)
Patient was not in the room when PA went in several times. Other staff also states patient was not in the room.

## 2017-04-19 NOTE — ED Triage Notes (Signed)
Pt reports low back pain for two days. Denies NVD, abdominal pain or fever at home. Ambulatory to triage with slow but steady gait.

## 2017-07-21 DIAGNOSIS — Z2821 Immunization not carried out because of patient refusal: Secondary | ICD-10-CM

## 2017-07-21 DIAGNOSIS — M545 Low back pain, unspecified: Secondary | ICD-10-CM

## 2017-07-21 HISTORY — DX: Low back pain, unspecified: M54.50

## 2017-07-21 HISTORY — DX: Immunization not carried out because of patient refusal: Z28.21

## 2017-07-22 ENCOUNTER — Emergency Department
Admission: EM | Admit: 2017-07-22 | Discharge: 2017-07-22 | Disposition: A | Discharge status: Home / Self Care | Payer: BLUE CROSS/BLUE SHIELD | Attending: Emergency Medicine | Admitting: Emergency Medicine

## 2017-07-22 ENCOUNTER — Encounter: Payer: Self-pay | Admitting: Emergency Medicine

## 2017-07-22 ENCOUNTER — Emergency Department: Payer: BLUE CROSS/BLUE SHIELD

## 2017-07-22 ENCOUNTER — Other Ambulatory Visit: Payer: Self-pay

## 2017-07-22 DIAGNOSIS — Z79899 Other long term (current) drug therapy: Secondary | ICD-10-CM | POA: Diagnosis not present

## 2017-07-22 DIAGNOSIS — N3090 Cystitis, unspecified without hematuria: Secondary | ICD-10-CM | POA: Insufficient documentation

## 2017-07-22 DIAGNOSIS — M545 Low back pain: Secondary | ICD-10-CM | POA: Diagnosis present

## 2017-07-22 LAB — URINALYSIS, COMPLETE (UACMP) WITH MICROSCOPIC
BACTERIA UA: NONE SEEN
BILIRUBIN URINE: NEGATIVE
Glucose, UA: NEGATIVE mg/dL
Hgb urine dipstick: NEGATIVE
KETONES UR: NEGATIVE mg/dL
NITRITE: NEGATIVE
Protein, ur: NEGATIVE mg/dL
Specific Gravity, Urine: 1.013 (ref 1.005–1.030)
pH: 7 (ref 5.0–8.0)

## 2017-07-22 MED ORDER — CEPHALEXIN 500 MG PO CAPS: 500.0000 mg | ORAL_CAPSULE | Freq: Two times a day (BID) | ORAL | 0 refills | Status: AC

## 2017-07-22 MED ORDER — KETOROLAC TROMETHAMINE 30 MG/ML IJ SOLN
30.0000 mg | Freq: Once | INTRAMUSCULAR | Status: AC
  Administered 2017-07-22: 30 mg via INTRAMUSCULAR
  Filled 2017-07-22: qty 1

## 2017-07-22 MED ORDER — LIDOCAINE 5 % EX PTCH
1.0000 | MEDICATED_PATCH | CUTANEOUS | Status: DC
  Administered 2017-07-22: 1 via TRANSDERMAL
  Filled 2017-07-22: qty 1

## 2017-07-22 NOTE — ED Notes (Signed)
Patient transported to X-ray 

## 2017-07-22 NOTE — ED Provider Notes (Signed)
[facility] Emergency Department Provider Note  ____________________________________________  Time seen: Approximately 7:44 AM  I have reviewed the triage vital signs and the nursing notes.   HISTORY  Chief Complaint Back Pain    HPI Renee Gray is a 57 y.o. female that presents to the emergency department for evaluation of low back pain for 4 days.  Back pain starts through her entire low back and radiates into her right hip.  Patient went to her PCP yesterday and was diagnosed with a lumbar strain.  She was given a prescription of Flexeril that is not helping.  She is urinating more frequently than usual.  She states that symptoms feel like a urinary tract infection. 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  No specific trauma.  No bowel or bladder dysfunction or saddle paresthesias.  No nausea, vomiting, abdominal pain numbness, tingling.   Past Medical History:  Diagnosis Date  . GERD (gastroesophageal reflux disease)     There are no active problems to display for this patient.   History reviewed. No pertinent surgical history.  Prior to Admission medications   Medication Sig Start Date End Date Taking? Authorizing Provider  Biotin 5000 MCG TABS Take 5,000 mcg by mouth daily.    [provider]  cephALEXin (KEFLEX) 500 MG capsule Take 1 capsule (500 mg total) by mouth 2 (two) times daily for 10 days. 07/22/17 08/01/17  Enid DerryWagner, Eureka Valdes, PA-C  omeprazole (PRILOSEC) 20 MG capsule Take 20 mg by mouth 2 (two) times daily before a meal.    [provider]  ondansetron (ZOFRAN ODT) 4 MG disintegrating tablet Take 1 tablet (4 mg total) by mouth every 8 (eight) hours as needed for nausea or vomiting. Patient not taking: Reported on 10/28/2016 05/29/16   Irean HongSung, Jade J, MD  pantoprazole (PROTONIX) 40 MG tablet Take 1 tablet (40 mg total) by mouth daily. Patient not taking: Reported on 10/28/2016 05/29/16   Irean HongSung, Jade  J, MD  phenazopyridine (PYRIDIUM) 95 MG tablet Take 2 tablets (190 mg total) by mouth 3 (three) times daily with meals as needed for pain (bladder pain/spasms). 10/28/16   Loleta RoseForbach, Cory, MD  ranitidine (ZANTAC) 150 MG tablet Take 1 tablet (150 mg total) by mouth at bedtime. Patient not taking: Reported on 10/28/2016 12/01/15 12/31/15  Hagler, Clearnce HastenJami L, PA-C    Allergies Patient has no known allergies.  No family history on file.  Social History Social History   Tobacco Use  . Smoking status: Never Smoker  . Smokeless tobacco: Never Used  Substance Use Topics  . Alcohol use: No  . Drug use: No     Review of Systems  Constitutional: No fever/chills Cardiovascular: No chest pain. Respiratory: No SOB. Gastrointestinal: No abdominal pain.  No nausea, no vomiting.  Musculoskeletal: Positive for back pain.  Skin: Negative for rash, abrasions, lacerations, ecchymosis. Neurological: Negative for headaches, numbness or tingling   ____________________________________________   PHYSICAL EXAM:  VITAL SIGNS: ED Triage Vitals  Enc Vitals Group     BP 07/22/17 0628 135/77     Pulse Rate 07/22/17 0628 84     Resp 07/22/17 0628 20     Temp 07/22/17 0628 97.9 F (36.6 C)     Temp Source 07/22/17 0628 Oral     SpO2 07/22/17 0628 97 %     Weight 07/22/17 0627 260 lb (117.9 kg)     Height 07/22/17 0627 5\' 7"  (1.702 m)  Head Circumference --      Peak Flow --      Pain Score 07/22/17 0627 10     Pain Loc --      Pain Edu? --      Excl. in GC? --      Constitutional: Alert and oriented. Well appearing and in no acute distress. Eyes: Conjunctivae are normal. PERRL. EOMI. Head: Atraumatic. ENT:      Ears:      Nose: No congestion/rhinnorhea.      Mouth/Throat: Mucous membranes are moist.  Neck: No stridor.   Cardiovascular: Normal rate, regular rhythm.  Good peripheral circulation. Respiratory: Normal respiratory effort without tachypnea or retractions. Lungs CTAB. Good air  entry to the bases with no decreased or absent breath sounds. Gastrointestinal: Bowel sounds 4 quadrants. Soft and nontender to palpation. No guarding or rigidity. No palpable masses. No distention. No CVA tenderness. Musculoskeletal: Full range of motion to all extremities. No gross deformities appreciated.  Tenderness to palpation throughout lumbar muscles.  No tenderness to palpation over lumbar spine.  Strength 5 out of 5 in lower extremities bilaterally.  Normal gait. Neurologic:  Normal speech and language. No gross focal neurologic deficits are appreciated.  Skin:  Skin is warm, dry and intact. No rash noted.   ____________________________________________   LABS (all labs ordered are listed, but only abnormal results are displayed)  Labs Reviewed  URINALYSIS, COMPLETE (UACMP) WITH MICROSCOPIC - Abnormal; Notable for the following components:      Result Value   Color, Urine YELLOW (*)    APPearance HAZY (*)    Leukocytes, UA LARGE (*)    Squamous Epithelial / LPF 6-30 (*)    All other components within normal limits   ____________________________________________  EKG   ____________________________________________  RADIOLOGY Lexine Baton, personally viewed and evaluated these images (plain radiographs) as part of my medical decision making, as well as reviewing the written report by the radiologist.  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 Mild anterolisthesis of L4 on L5 by 3 mm. No acute loss vertebral body height and disc height. No pars fracture. IMPRESSION: 1. No acute findings lumbar spine. 2. Mild grade 1 anterolisthesis of L4 on L5. 3. No pars fracture  identified. Electronically Signed   By: Genevive Bi M.D.   On: 07/22/2017 08:38    ____________________________________________    PROCEDURES  Procedure(s) performed:    Procedures    Medications  ketorolac (TORADOL) 30 MG/ML injection 30 mg (30 mg Intramuscular Given 07/22/17 0820)     ____________________________________________   INITIAL IMPRESSION / ASSESSMENT AND PLAN / ED COURSE  Pertinent labs & imaging results that were available during my care of the patient were reviewed by me and considered in my medical decision making (see chart for details).  Review of the [place] CSRS was performed in accordance of the [facility] prior to dispensing any controlled drugs.   Patient's diagnosis is consistent with urinary tract infection.  Vital signs and exam are reassuring.  X-ray negative for acute bony abnormalities.  Results of x-ray were discussed with patient.  She was given IM Toradol in ED and felt better after medication.  Urinalysis consistent with infection.  Patient will be discharged home with prescriptions for Keflex.  She will continue taking Flexeril and ibuprofen from PCP yesterday.  Patient is to follow up with PCP as directed. Patient is given ED precautions to return to the ED for any worsening or new symptoms.     ____________________________________________  FINAL CLINICAL IMPRESSION(S) / ED DIAGNOSES  Final diagnoses:  Cystitis      NEW MEDICATIONS STARTED DURING THIS VISIT:  ED Discharge Orders        Ordered    cephALEXin (KEFLEX) 500 MG capsule  2 times daily     07/22/17 2440          This chart was dictated using voice recognition software/Dragon. Despite best efforts to proofread, errors can occur which can change the meaning. Any change was purely unintentional.    Enid Derry, PA-C 07/22/17 1526    Phineas Semen, MD 07/23/17 [phone]

## 2017-07-22 NOTE — ED Triage Notes (Addendum)
Pt to triage via w/c with no distress noted; pt c/o lower back pain radiating down right leg; [facility] and "stuff they gave me made it worser"; st pain began after pickup up heavy item last week

## 2017-12-08 DIAGNOSIS — R350 Frequency of micturition: Secondary | ICD-10-CM | POA: Insufficient documentation

## 2017-12-08 DIAGNOSIS — Z23 Encounter for immunization: Secondary | ICD-10-CM

## 2017-12-08 HISTORY — DX: Frequency of micturition: R35.0

## 2017-12-08 HISTORY — DX: Encounter for immunization: Z23

## 2018-01-17 ENCOUNTER — Emergency Department: Payer: BLUE CROSS/BLUE SHIELD

## 2018-01-17 ENCOUNTER — Emergency Department
Admission: EM | Admit: 2018-01-17 | Discharge: 2018-01-17 | Disposition: A | Discharge status: Home / Self Care | Payer: BLUE CROSS/BLUE SHIELD | Attending: Emergency Medicine | Admitting: Emergency Medicine

## 2018-01-17 ENCOUNTER — Other Ambulatory Visit: Payer: Self-pay

## 2018-01-17 ENCOUNTER — Encounter: Payer: Self-pay | Admitting: Emergency Medicine

## 2018-01-17 DIAGNOSIS — Y92009 Unspecified place in unspecified non-institutional (private) residence as the place of occurrence of the external cause: Secondary | ICD-10-CM | POA: Insufficient documentation

## 2018-01-17 DIAGNOSIS — S46002A Unspecified injury of muscle(s) and tendon(s) of the rotator cuff of left shoulder, initial encounter: Secondary | ICD-10-CM | POA: Insufficient documentation

## 2018-01-17 DIAGNOSIS — X500XXA Overexertion from strenuous movement or load, initial encounter: Secondary | ICD-10-CM | POA: Insufficient documentation

## 2018-01-17 DIAGNOSIS — Y93F2 Activity, caregiving, lifting: Secondary | ICD-10-CM | POA: Diagnosis not present

## 2018-01-17 DIAGNOSIS — Y990 Civilian activity done for income or pay: Secondary | ICD-10-CM | POA: Insufficient documentation

## 2018-01-17 DIAGNOSIS — S46912A Strain of unspecified muscle, fascia and tendon at shoulder and upper arm level, left arm, initial encounter: Secondary | ICD-10-CM | POA: Insufficient documentation

## 2018-01-17 DIAGNOSIS — Z79899 Other long term (current) drug therapy: Secondary | ICD-10-CM | POA: Insufficient documentation

## 2018-01-17 DIAGNOSIS — S4992XA Unspecified injury of left shoulder and upper arm, initial encounter: Secondary | ICD-10-CM | POA: Diagnosis present

## 2018-01-17 MED ORDER — BACLOFEN 10 MG PO TABS: 10.0000 mg | ORAL_TABLET | Freq: Every day | ORAL | 1 refills | Status: AC

## 2018-01-17 MED ORDER — MELOXICAM 15 MG PO TABS: 15.0000 mg | ORAL_TABLET | Freq: Every day | ORAL | 2 refills | Status: AC

## 2018-01-17 NOTE — Discharge Instructions (Signed)
Follow-up with your regular doctor Dr. Rosita KeaMenz.  Please call for an appointment if you are not better in 3 to 5 days.  Wear the sling for 3 days.  Use medication as prescribed.  Apply ice for at least 30 minutes 3 times daily.  Return if worsening

## 2018-01-17 NOTE — ED Provider Notes (Signed)
[facility] Emergency Department Provider Note  ____________________________________________   First MD Initiated Contact with Patient 01/17/18 1445     (approximate)  I have reviewed the triage vital signs and the nursing notes.   HISTORY  Chief Complaint Arm Injury    HPI Renee Gray is a 57 y.o. female presents to the emergency department complaining of left shoulder pain.  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  She denies numbness or tingling.  States she cannot raise her arm over her head or out to the side.  Trace.    Past Medical History:  Diagnosis Date  . GERD (gastroesophageal reflux disease)   . GERD (gastroesophageal reflux disease)     There are no active problems to display for this patient.   History reviewed. No pertinent surgical history.  Prior to Admission medications   Medication Sig Start Date End Date Taking? Authorizing Provider  baclofen (LIORESAL) 10 MG tablet Take 1 tablet (10 mg total) by mouth daily. 01/17/18 01/17/19  Faythe Ghee, PA-C  Biotin 5000 MCG TABS Take 5,000 mcg by mouth daily.    [provider]  meloxicam (MOBIC) 15 MG tablet Take 1 tablet (15 mg total) by mouth daily. 01/17/18 01/17/19  Fisher, Roselyn Bering, PA-C  omeprazole (PRILOSEC) 20 MG capsule Take 20 mg by mouth 2 (two) times daily before a meal.    [provider]  phenazopyridine (PYRIDIUM) 95 MG tablet Take 2 tablets (190 mg total) by mouth 3 (three) times daily with meals as needed for pain (bladder pain/spasms). 10/28/16   Loleta Rose, MD    Allergies Patient has no known allergies.  History reviewed. No pertinent family history.  Social History Social History   Tobacco Use  . Smoking status: Never Smoker  . Smokeless tobacco: Never Used  Substance Use Topics  . Alcohol use: No  . Drug use:  No    Review of Systems  Constitutional: No fever/chills Eyes: No visual changes. ENT: No sore throat. Respiratory: Denies cough Genitourinary: Negative for dysuria. Musculoskeletal: Negative for back pain.  Positive left shoulder pain Skin: Negative for rash.    ____________________________________________   PHYSICAL EXAM:  VITAL SIGNS: ED Triage Vitals  Enc Vitals Group     BP 01/17/18 1413 (!) 161/87     Pulse Rate 01/17/18 1413 86     Resp --      Temp 01/17/18 1413 98.2 F (36.8 C)     Temp Source 01/17/18 1413 Oral     SpO2 01/17/18 1413 100 %     Weight 01/17/18 1414 240 lb (108.9 kg)     Height 01/17/18 1414 5\' 8"  (1.727 m)     Head Circumference --      Peak Flow --      Pain Score 01/17/18 1413 10     Pain Loc --      Pain Edu? --      Excl. in GC? --     Constitutional: Alert and oriented. Well appearing and in no acute distress. Eyes: Conjunctivae are normal.  Head: Atraumatic. Nose: No congestion/rhinnorhea. Mouth/Throat: Mucous membranes are moist.   Neck:  supple no lymphadenopathy noted Cardiovascular: Normal rate, regular rhythm. Heart sounds are normal Respiratory: Normal respiratory effort.  No retractions, lungs c t a  GU: deferred Musculoskeletal: Decreased range of motion of  the left shoulder.  The patient is unable to reach overhead and to the side.  The left shoulder, bursa, and deltoid area are tender to palpation.  The anterior is slightly tender to palpation.  Grips are equal bilaterally and she is neurovascularly intact. Neurologic:  Normal speech and language.  Skin:  Skin is warm, dry and intact. No rash noted. Psychiatric: Mood and affect are normal. Speech and behavior are normal.  ____________________________________________   LABS (all labs ordered are listed, but only abnormal results are displayed)  Labs Reviewed - No data to  display ____________________________________________   ____________________________________________  RADIOLOGY  X-ray of the left shoulder is negative for any acute abnormality  ____________________________________________   PROCEDURES  Procedure(s) performed: Sling was applied by nursing staff  Procedures    ____________________________________________   INITIAL IMPRESSION / ASSESSMENT AND PLAN / ED COURSE  Pertinent labs & imaging results that were available during my care of the patient were reviewed by me and considered in my medical decision making (see chart for details).   Patient is a 57 year old female complains of left shoulder pain after pulling on a patient last night.  On physical exam patient has decreased range of motion of the left shoulder.  She is unable to reach overhead and to the side.  Grips are equal bilaterally and she is neurovascularly intact.  X-ray of the left shoulder is negative  Discussed the findings with patient.  She was given a sling.  She is to apply ice to the area for 30 minutes 3 times a day.  She is given a prescription for meloxicam and baclofen.  She was given light duty for work as she should not be pulling or lifting for 1 week.  She states she understands will comply with our instructions.  She was discharged in stable condition     As part of my medical decision making, I reviewed the following data within the electronic MEDICAL RECORD NUMBER Nursing notes reviewed and incorporated, Old chart reviewed, Radiograph reviewed x-ray left shoulder is negative, Notes from prior ED visits and [place] Controlled Substance Database  ____________________________________________   FINAL CLINICAL IMPRESSION(S) / ED DIAGNOSES  Final diagnoses:  Left shoulder strain, initial encounter  Rotator cuff injury, left, initial encounter      NEW MEDICATIONS STARTED DURING THIS VISIT:  Discharge Medication List as of 01/17/2018  2:57 PM    START  taking these medications   Details  baclofen (LIORESAL) 10 MG tablet Take 1 tablet (10 mg total) by mouth daily., Starting Mon 01/17/2018, Until Tue 01/17/2019, Normal    meloxicam (MOBIC) 15 MG tablet Take 1 tablet (15 mg total) by mouth daily., Starting Mon 01/17/2018, Until Tue 01/17/2019, Normal         Note:  This document was prepared using Dragon voice recognition software and may include unintentional dictation errors.    Faythe GheeFisher, Susan W, PA-C 01/17/18 1558    Jene EveryKinner, Robert, MD 01/17/18 [phone]

## 2018-01-17 NOTE — ED Notes (Signed)
See triage note  Presents with pain to left shoulder ..states pain radiates under arm and down her lateral rib area  States pain started after lifting  No deformity noted

## 2018-01-17 NOTE — ED Triage Notes (Signed)
Pt with left shoulder pain that started yesterday after pulling pt up. Pt able to move arm but buming pain 10/10

## 2018-01-18 DIAGNOSIS — I10 Essential (primary) hypertension: Secondary | ICD-10-CM | POA: Insufficient documentation

## 2018-07-09 ENCOUNTER — Encounter: Payer: Self-pay | Admitting: *Deleted

## 2018-07-09 ENCOUNTER — Other Ambulatory Visit: Payer: Self-pay

## 2018-07-09 ENCOUNTER — Emergency Department
Admission: EM | Admit: 2018-07-09 | Discharge: 2018-07-09 | Disposition: A | Discharge status: Home / Self Care | Payer: BLUE CROSS/BLUE SHIELD | Attending: Emergency Medicine | Admitting: Emergency Medicine

## 2018-07-09 DIAGNOSIS — Y92009 Unspecified place in unspecified non-institutional (private) residence as the place of occurrence of the external cause: Secondary | ICD-10-CM | POA: Diagnosis not present

## 2018-07-09 DIAGNOSIS — Y990 Civilian activity done for income or pay: Secondary | ICD-10-CM | POA: Insufficient documentation

## 2018-07-09 DIAGNOSIS — Z79899 Other long term (current) drug therapy: Secondary | ICD-10-CM | POA: Diagnosis not present

## 2018-07-09 DIAGNOSIS — X500XXA Overexertion from strenuous movement or load, initial encounter: Secondary | ICD-10-CM | POA: Insufficient documentation

## 2018-07-09 DIAGNOSIS — S299XXA Unspecified injury of thorax, initial encounter: Secondary | ICD-10-CM | POA: Diagnosis present

## 2018-07-09 DIAGNOSIS — S29012A Strain of muscle and tendon of back wall of thorax, initial encounter: Secondary | ICD-10-CM | POA: Diagnosis not present

## 2018-07-09 DIAGNOSIS — Y93F2 Activity, caregiving, lifting: Secondary | ICD-10-CM | POA: Diagnosis not present

## 2018-07-09 DIAGNOSIS — S29019A Strain of muscle and tendon of unspecified wall of thorax, initial encounter: Secondary | ICD-10-CM

## 2018-07-09 LAB — CBC
HEMATOCRIT: 28 % — AB (ref 36.0–46.0)
HEMOGLOBIN: 7.9 g/dL — AB (ref 12.0–15.0)
MCH: 22.5 pg — AB (ref 26.0–34.0)
MCHC: 28.2 g/dL — AB (ref 30.0–36.0)
MCV: 79.8 fL — ABNORMAL LOW (ref 80.0–100.0)
Platelets: 288 10*3/uL (ref 150–400)
RBC: 3.51 MIL/uL — ABNORMAL LOW (ref 3.87–5.11)
RDW: 18.6 % — AB (ref 11.5–15.5)
WBC: 6.2 10*3/uL (ref 4.0–10.5)
nRBC: 0 % (ref 0.0–0.2)

## 2018-07-09 LAB — COMPREHENSIVE METABOLIC PANEL
ALBUMIN: 3.9 g/dL (ref 3.5–5.0)
ALT: 19 U/L (ref 0–44)
AST: 25 U/L (ref 15–41)
Alkaline Phosphatase: 84 U/L (ref 38–126)
Anion gap: 4 — ABNORMAL LOW (ref 5–15)
BILIRUBIN TOTAL: 0.7 mg/dL (ref 0.3–1.2)
BUN: 14 mg/dL (ref 6–20)
CO2: 26 mmol/L (ref 22–32)
CREATININE: 0.88 mg/dL (ref 0.44–1.00)
Calcium: 9.1 mg/dL (ref 8.9–10.3)
Chloride: 108 mmol/L (ref 98–111)
GFR calc Af Amer: >60 mL/min (ref >60)
GLUCOSE: 91 mg/dL (ref 70–99)
POTASSIUM: 3.5 mmol/L (ref 3.5–5.1)
Sodium: 138 mmol/L (ref 135–145)
TOTAL PROTEIN: 8 g/dL (ref 6.5–8.1)

## 2018-07-09 LAB — URINALYSIS, COMPLETE (UACMP) WITH MICROSCOPIC
Bacteria, UA: NONE SEEN
Bilirubin Urine: NEGATIVE
GLUCOSE, UA: NEGATIVE mg/dL
Hgb urine dipstick: NEGATIVE
Ketones, ur: NEGATIVE mg/dL
Leukocytes, UA: NEGATIVE
Nitrite: NEGATIVE
PH: 5 (ref 5.0–8.0)
Protein, ur: NEGATIVE mg/dL
SPECIFIC GRAVITY, URINE: 1.024 (ref 1.005–1.030)

## 2018-07-09 MED ORDER — DIAZEPAM 5 MG PO TABS: 5.0000 mg | ORAL_TABLET | Freq: Every evening | ORAL | 0 refills | Status: DC | PRN

## 2018-07-09 MED ORDER — TRAMADOL HCL 50 MG PO TABS: 50.0000 mg | ORAL_TABLET | Freq: Four times a day (QID) | ORAL | 0 refills | Status: AC | PRN

## 2018-07-09 MED ORDER — LIDOCAINE 5 % EX PTCH: 1.0000 | MEDICATED_PATCH | Freq: Two times a day (BID) | CUTANEOUS | 0 refills | Status: DC

## 2018-07-09 MED ORDER — KETOROLAC TROMETHAMINE 30 MG/ML IJ SOLN
15.0000 mg | INTRAMUSCULAR | Status: AC
  Administered 2018-07-09: 15 mg via INTRAVENOUS
  Filled 2018-07-09: qty 1

## 2018-07-09 MED ORDER — NAPROXEN 500 MG PO TABS: 500.0000 mg | ORAL_TABLET | Freq: Two times a day (BID) | ORAL | 0 refills | Status: DC

## 2018-07-09 MED ORDER — ACETAMINOPHEN 325 MG PO TABS: 650.0000 mg | ORAL_TABLET | ORAL | 0 refills | Status: AC

## 2018-07-09 NOTE — ED Triage Notes (Signed)
Patient c/o left flank pain that began two days ago. Patient denies injury, states she has a history of urinary tract infections. Patient c/o urinary frequency, denies burning with urination.

## 2018-07-09 NOTE — Discharge Instructions (Addendum)
Take naproxen 2 times a day every day for the next 7 days. Take Tylenol every 4-6 hours every day for the next 7 days. Use a lidocaine patch on the skin in the area of the greatest pain every day.  In addition, you can take tramadol every 6 hours as needed for additional pain control on top of the naproxen and Tylenol and lidocaine patch.  Take Valium at night as needed for muscle relaxation to help with sleep.  Avoid driving or potentially dangerous activities while taking tramadol or Valium.

## 2018-07-09 NOTE — ED Provider Notes (Signed)
[facility] Emergency Department Provider Note  ____________________________________________  Time seen: Approximately 4:43 PM  I have reviewed the triage vital signs and the nursing notes.   HISTORY  Chief Complaint Flank Pain    HPI Renee Gray is a 58 y.o. female with a past medical history of GERD who complains of acute onset of left mid back pain 2 days ago.  Pain is continuous, sharp, severe, nonradiating.  Worse with twisting motion of the thorax, no alleviating factors.  No chest pain shortness of breath fevers chills vomiting diarrhea dysuria frequency urgency or hematuria.   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  She describes that person is "dead weight "and is very strenuous for her to lift this person and help move them about.  Today is Saturday so she is just completed a 5-day work week.   Past Medical History:  Diagnosis Date  . GERD (gastroesophageal reflux disease)   . GERD (gastroesophageal reflux disease)      There are no active problems to display for this patient.    History reviewed. No pertinent surgical history.   Prior to Admission medications   Medication Sig Start Date End Date Taking? Authorizing Provider  acetaminophen (TYLENOL) 325 MG tablet Take 2 tablets (650 mg total) by mouth every 4 (four) hours for 10 days. 07/09/18 07/19/18  Sharman CheekStafford, Raea Magallon, MD  baclofen (LIORESAL) 10 MG tablet Take 1 tablet (10 mg total) by mouth daily. 01/17/18 01/17/19  Faythe GheeFisher, Susan W, PA-C  Biotin 5000 MCG TABS Take 5,000 mcg by mouth daily.    [provider]  diazepam (VALIUM) 5 MG tablet Take 1 tablet (5 mg total) by mouth at bedtime as needed for muscle spasms. 07/09/18   Sharman CheekStafford, Aruna Nestler, MD  lidocaine (LIDODERM) 5 % Place 1 patch onto the skin every 12 (twelve) hours. Remove & Discard patch within 12 hours or as directed by MD 07/09/18   Sharman CheekStafford, Carlina Derks, MD  meloxicam  (MOBIC) 15 MG tablet Take 1 tablet (15 mg total) by mouth daily. 01/17/18 01/17/19  Fisher, Roselyn BeringSusan W, PA-C  naproxen (NAPROSYN) 500 MG tablet Take 1 tablet (500 mg total) by mouth 2 (two) times daily with a meal. 07/09/18   Sharman CheekStafford, Gleen Ripberger, MD  omeprazole (PRILOSEC) 20 MG capsule Take 20 mg by mouth 2 (two) times daily before a meal.    [provider]  phenazopyridine (PYRIDIUM) 95 MG tablet Take 2 tablets (190 mg total) by mouth 3 (three) times daily with meals as needed for pain (bladder pain/spasms). 10/28/16   Loleta RoseForbach, Cory, MD  traMADol (ULTRAM) 50 MG tablet Take 1 tablet (50 mg total) by mouth every 6 (six) hours as needed for up to 3 days. 07/09/18 07/12/18  Sharman CheekStafford, Hoyt Leanos, MD     Allergies Patient has no known allergies.   No family history on file.  Social History Social History   Tobacco Use  . Smoking status: Never Smoker  . Smokeless tobacco: Never Used  Substance Use Topics  . Alcohol use: No  . Drug use: No    Review of Systems  Constitutional:   No fever or chills.  ENT:   No sore throat. No rhinorrhea. Cardiovascular:   No chest pain or syncope. Respiratory:   No dyspnea or cough. Gastrointestinal:   Negative for abdominal pain, vomiting and diarrhea.  Musculoskeletal:   Positive left mid back pain as above.   All other systems reviewed and  are negative except as documented above in ROS and HPI.  ____________________________________________   PHYSICAL EXAM:  VITAL SIGNS: ED Triage Vitals  Enc Vitals Group     BP 07/09/18 1303 139/85     Pulse Rate 07/09/18 1303 88     Resp 07/09/18 1303 18     Temp 07/09/18 1303 97.8 F (36.6 C)     Temp Source 07/09/18 1303 Oral     SpO2 07/09/18 1303 98 %     Weight 07/09/18 1304 230 lb (104.3 kg)     Height 07/09/18 1304 5\' 7"  (1.702 m)     Head Circumference --      Peak Flow --      Pain Score 07/09/18 1303 10     Pain Loc --      Pain Edu? --      Excl. in GC? --     Vital signs reviewed, nursing  assessments reviewed.   Constitutional:   Alert and oriented. Non-toxic appearance. Eyes:   Conjunctivae are normal. EOMI. PERRL. ENT      Head:   Normocephalic and atraumatic.      Nose:   No congestion/rhinnorhea.       Mouth/Throat:   MMM, no pharyngeal erythema. No peritonsillar mass.       Neck:   No meningismus. Full ROM. Hematological/Lymphatic/Immunilogical:   No cervical lymphadenopathy. Cardiovascular:   RRR. Symmetric bilateral radial and DP pulses.  No murmurs. Cap refill less than 2 seconds. Respiratory:   Normal respiratory effort without tachypnea/retractions. Breath sounds are clear and equal bilaterally. No wheezes/rales/rhonchi. Gastrointestinal:   Soft and nontender. Non distended. There is no CVA tenderness.  No rebound, rigidity, or guarding.  Musculoskeletal:   Normal range of motion in all extremities. No joint effusions.  No lower extremity tenderness.  No edema.  No midline spinal tenderness.  On the thoracic back, on the left over the inferior ribs, there is an area of muscle spasm and pronounced tenderness of the soft tissue musculature which reproduces her pain. Neurologic:   Normal speech and language.  Motor grossly intact. No acute focal neurologic deficits are appreciated.  Skin:    Skin is warm, dry and intact. No rash noted.  No petechiae, purpura, or bullae.  ____________________________________________    LABS (pertinent positives/negatives) (all labs ordered are listed, but only abnormal results are displayed) Labs Reviewed  URINALYSIS, COMPLETE (UACMP) WITH MICROSCOPIC - Abnormal; Notable for the following components:      Result Value   Color, Urine YELLOW (*)    APPearance CLEAR (*)    All other components within normal limits  COMPREHENSIVE METABOLIC PANEL - Abnormal; Notable for the following components:   Anion gap 4 (*)    All other components within normal limits  CBC - Abnormal; Notable for the following components:   RBC 3.51 (*)     Hemoglobin 7.9 (*)    HCT 28.0 (*)    MCV 79.8 (*)    MCH 22.5 (*)    MCHC 28.2 (*)    RDW 18.6 (*)    All other components within normal limits   ____________________________________________   EKG    ____________________________________________    RADIOLOGY  No results found.  ____________________________________________   PROCEDURES Procedures  ____________________________________________    CLINICAL IMPRESSION / ASSESSMENT AND PLAN / ED COURSE  Pertinent labs & imaging results that were available during my care of the patient were reviewed by me and considered in my medical decision making (  see chart for details).    Patient presents with left mid back pain.  Doubt ACS PE dissection pneumothorax AAA perforation obstruction peptic ulcer disease cystitis pyelonephritis or obstructing stone.  Labs are normal, vital signs are normal.  Exam is consistent with musculoskeletal injury, which also makes sense with her occupational activity.  Plan to treat with NSAIDs, lidocaine patch, very limited use of tramadol and Valium, follow-up with primary care.  Controlled substance reporting system reviewed.      ____________________________________________   FINAL CLINICAL IMPRESSION(S) / ED DIAGNOSES    Final diagnoses:  Thoracic myofascial strain, initial encounter     ED Discharge Orders         Ordered    naproxen (NAPROSYN) 500 MG tablet  2 times daily with meals     07/09/18 1643    traMADol (ULTRAM) 50 MG tablet  Every 6 hours PRN     07/09/18 1643    lidocaine (LIDODERM) 5 %  Every 12 hours     07/09/18 1643    diazepam (VALIUM) 5 MG tablet  At bedtime PRN     07/09/18 1643    acetaminophen (TYLENOL) 325 MG tablet  Every 4 hours     07/09/18 1643          Portions of this note were generated with dragon dictation software. Dictation errors may occur despite best attempts at proofreading.   Sharman Cheek, MD 07/09/18 [id number]

## 2018-07-09 NOTE — ED Notes (Addendum)
Reports that her pain is back. MD aware.

## 2018-07-09 NOTE — ED Notes (Signed)
First Nurse Note: Pt c/o left flank pain. Pt in NAD

## 2019-01-23 DIAGNOSIS — E785 Hyperlipidemia, unspecified: Secondary | ICD-10-CM | POA: Insufficient documentation

## 2019-11-12 IMAGING — CR DG SHOULDER 2+V*L*
3 series · 3 of 3 positions shown · non-contrast
Comparison: None.

CLINICAL DATA: LEFT shoulder pain starting yesterday, injury
yesterday at work.

EXAM:
LEFT SHOULDER - 2+ VIEW

[shoulder grashey]
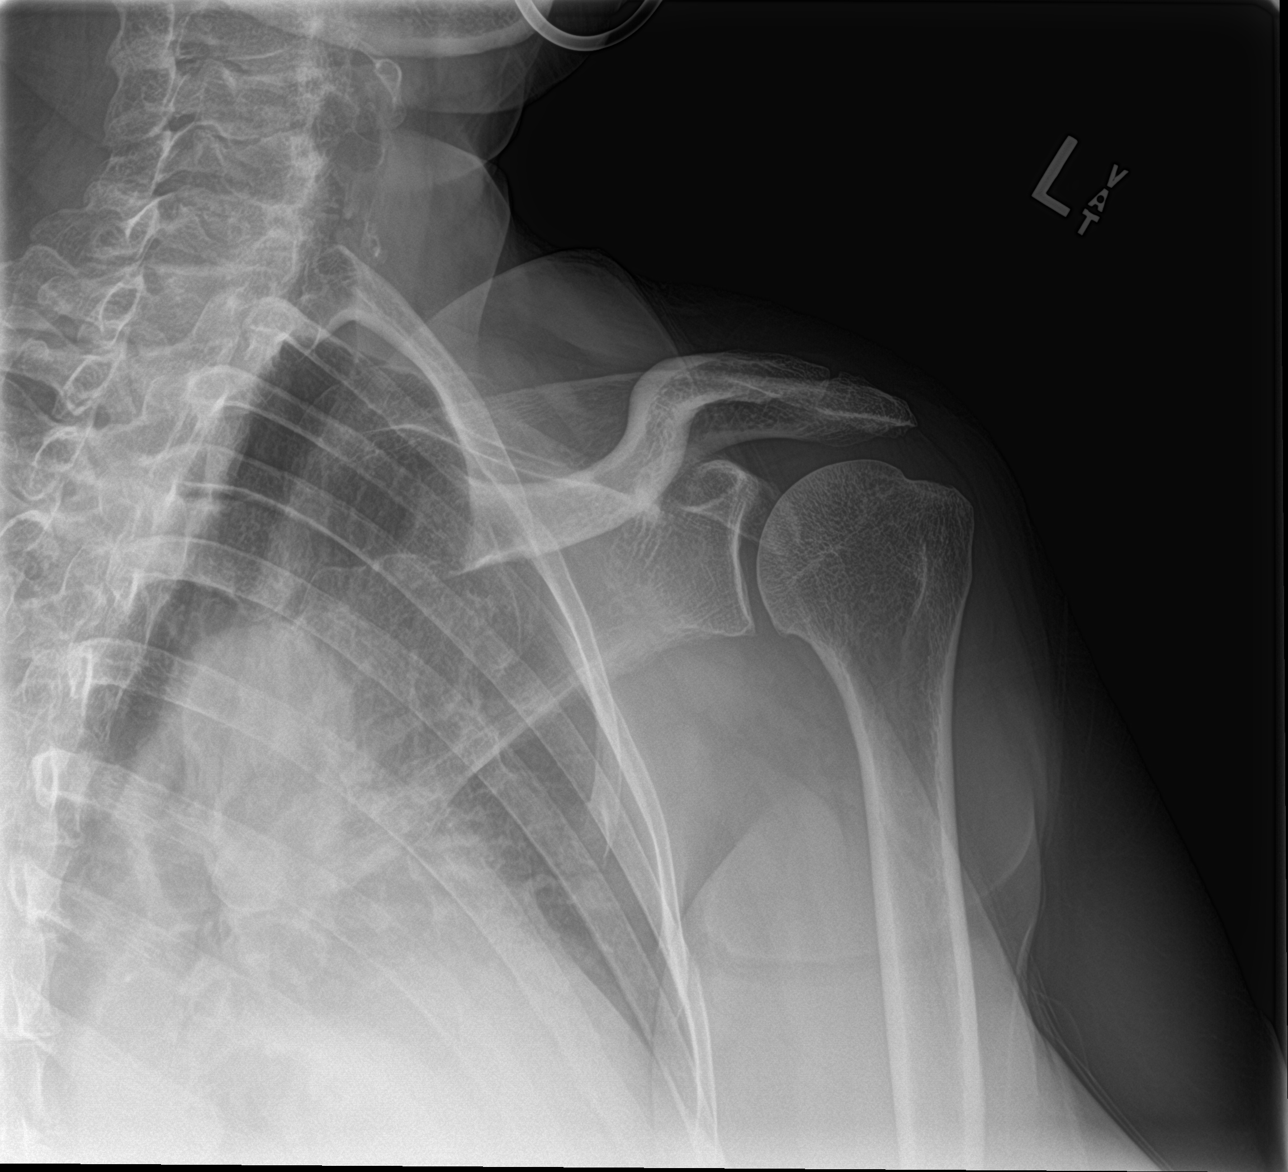

[shoulder y view]
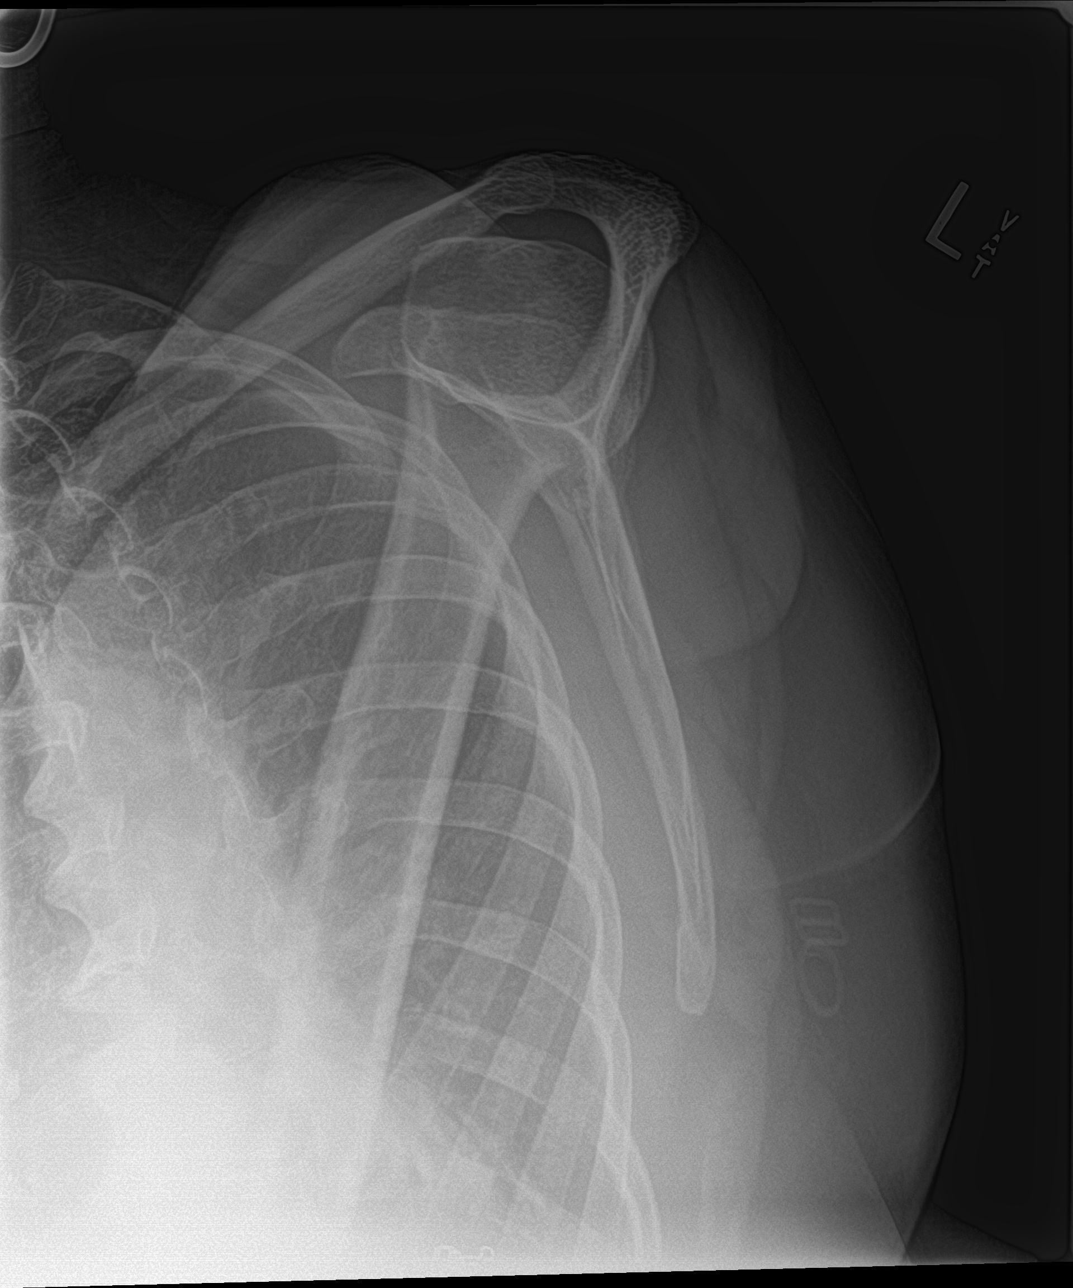

[shoulder axillary]
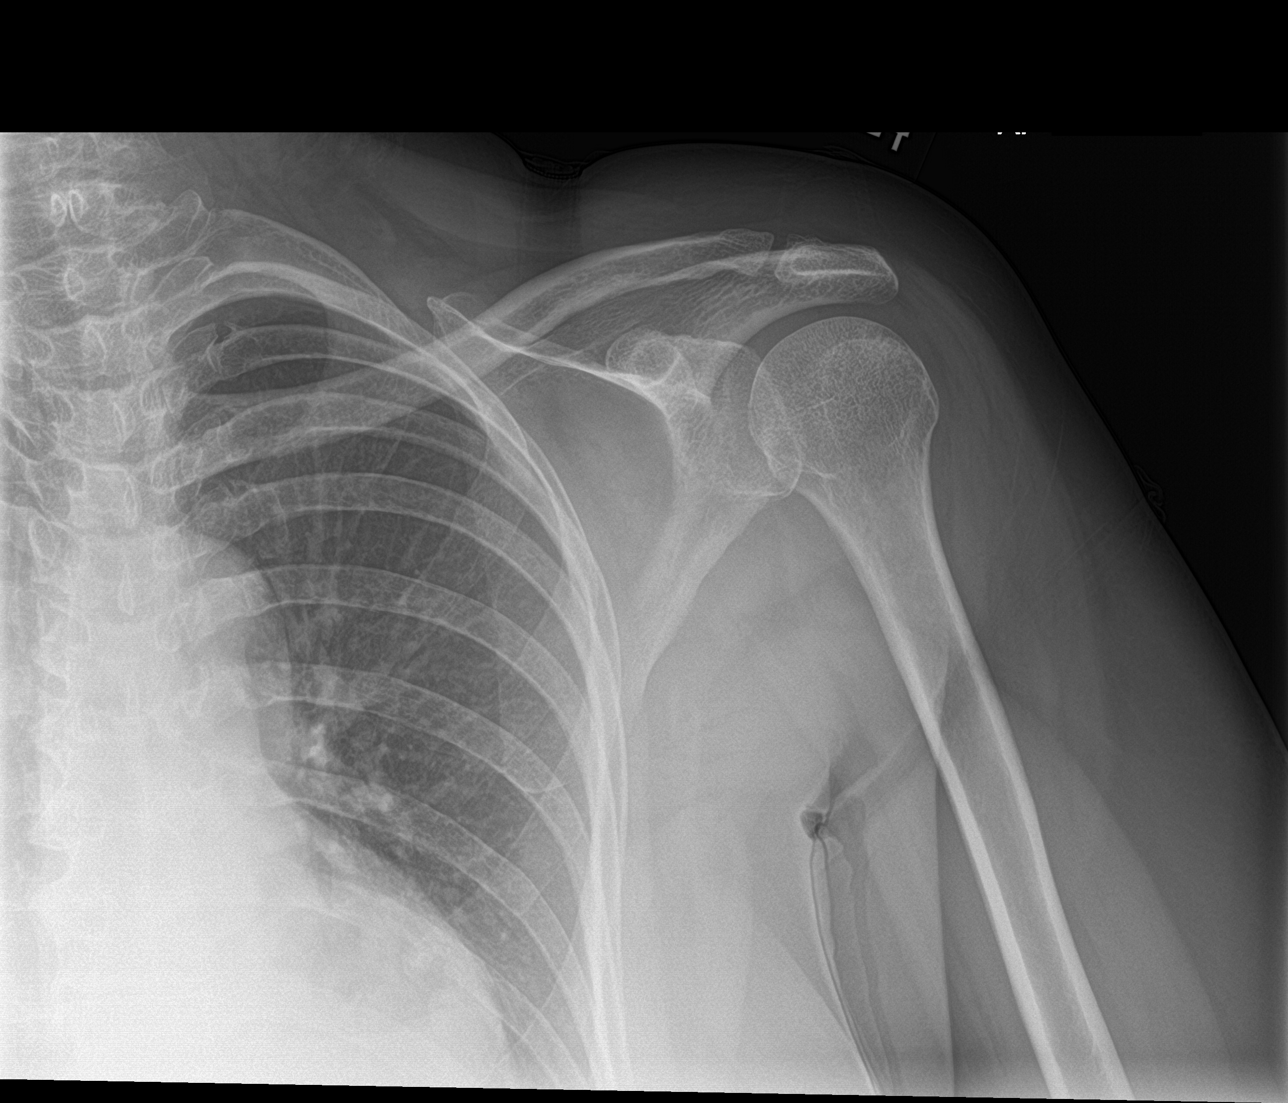

[3 of 3 positions shown; findings below may reference images not displayed]

FINDINGS: There is no evidence of fracture or dislocation. There is no
evidence of arthropathy or other focal bone abnormality. Soft
tissues are unremarkable.
IMPRESSION: Negative.

## 2020-03-12 ENCOUNTER — Other Ambulatory Visit: Payer: Self-pay

## 2020-03-12 ENCOUNTER — Ambulatory Visit: Admission: EM | Admit: 2020-03-12 | Discharge: 2020-03-12 | Discharge status: Against Medical Advice | Payer: 59

## 2020-11-08 ENCOUNTER — Ambulatory Visit (LOCAL_COMMUNITY_HEALTH_CENTER): Payer: Medicaid Other

## 2020-11-08 ENCOUNTER — Other Ambulatory Visit: Payer: Self-pay

## 2020-11-08 DIAGNOSIS — Z111 Encounter for screening for respiratory tuberculosis: Secondary | ICD-10-CM

## 2020-11-08 DIAGNOSIS — Z9289 Personal history of other medical treatment: Secondary | ICD-10-CM

## 2020-11-08 NOTE — Progress Notes (Signed)
In Nurse Clinic for TB screen. Hx +ppd 2004. TB Screen completed and given to pt. Copy sent for scanning. Jerel Shepherd, RN

## 2022-03-10 DIAGNOSIS — R7303 Prediabetes: Secondary | ICD-10-CM | POA: Diagnosis not present

## 2022-03-10 DIAGNOSIS — M545 Low back pain, unspecified: Secondary | ICD-10-CM | POA: Diagnosis not present

## 2022-03-10 DIAGNOSIS — N760 Acute vaginitis: Secondary | ICD-10-CM | POA: Diagnosis not present

## 2022-03-10 DIAGNOSIS — I10 Essential (primary) hypertension: Secondary | ICD-10-CM | POA: Diagnosis not present

## 2022-03-10 DIAGNOSIS — E782 Mixed hyperlipidemia: Secondary | ICD-10-CM | POA: Diagnosis not present

## 2022-05-14 ENCOUNTER — Ambulatory Visit: Payer: Medicaid Other

## 2022-07-15 ENCOUNTER — Ambulatory Visit (LOCAL_COMMUNITY_HEALTH_CENTER): Payer: Self-pay

## 2022-07-15 DIAGNOSIS — Z111 Encounter for screening for respiratory tuberculosis: Secondary | ICD-10-CM

## 2022-07-15 NOTE — Progress Notes (Signed)
In nurse clinic for TB screen.  Hx + PPD 2004.  TB screening form completed and given to pt.  Denies symptoms (see flow sheet).  Copy of TB screening form sent for scanning.   Tonny Branch, RN

## 2022-08-06 ENCOUNTER — Ambulatory Visit: Payer: Medicaid Other

## 2022-08-17 ENCOUNTER — Ambulatory Visit: Payer: Medicaid Other | Admitting: Family

## 2022-08-17 ENCOUNTER — Encounter: Payer: Self-pay | Admitting: Family

## 2022-08-17 DIAGNOSIS — Z113 Encounter for screening for infections with a predominantly sexual mode of transmission: Secondary | ICD-10-CM | POA: Diagnosis not present

## 2022-08-17 DIAGNOSIS — B3731 Acute candidiasis of vulva and vagina: Secondary | ICD-10-CM

## 2022-08-17 LAB — WET PREP FOR TRICH, YEAST, CLUE: Trichomonas Exam: NEGATIVE

## 2022-08-17 LAB — HM HIV SCREENING LAB: HM HIV Screening: NEGATIVE

## 2022-08-17 MED ORDER — CLOTRIMAZOLE 1 % VA CREA: 1.0000 | TOPICAL_CREAM | Freq: Every day | VAGINAL | 0 refills | Status: AC

## 2022-08-17 NOTE — Progress Notes (Signed)
Pt is here for STD screening.  Wet mount results reviewed.  The patient was dispensed Clotrimazole 1% vaginal cream today. I provided counseling today regarding the medication. We discussed the medication, the side effects and when to call clinic. Patient given the opportunity to ask questions. Questions answered.  Condoms given.

## 2022-08-17 NOTE — Addendum Note (Signed)
Addended by: Windle Guard on: 08/17/2022 04:29 PM   Modules accepted: Orders

## 2022-08-17 NOTE — Progress Notes (Signed)
[facility] Department  STI clinic/screening visit [place] [address] [phone]  Subjective:  Renee Gray is a 62 y.o. female being seen today for an STI screening visit. The patient reports they do not have symptoms.  Patient reports that they do not desire a pregnancy in the next year.   They reported they are not interested in discussing contraception today.    No LMP recorded. Patient is postmenopausal.  Patient has the following medical conditions:  There are no problems to display for this patient.   Chief Complaint  Patient presents with   SEXUALLY TRANSMITTED DISEASE    Screening    HPI  Patient reports wanting STD Testing, asymptomatic but needs to get checked periodically because unsure of partner's sexual activity.  Does the patient using douching products? No  Last HIV test per patient/review of record was No results found for: "HMHIVSCREEN" No results found for: "HIV" Patient reports last pap was No results found for: "DIAGPAP" No results found for: "SPECADGYN"  Screening for MPX risk: Does the patient have an unexplained rash? No Is the patient MSM? No Does the patient endorse multiple sex partners or anonymous sex partners? No Did the patient have close or sexual contact with a person diagnosed with MPX? No Has the patient traveled outside the [place] where MPX is endemic? No Is there a high clinical suspicion for MPX-- evidenced by one of the following No  -Unlikely to be chickenpox  -Lymphadenopathy  -Rash that present in same phase of evolution on any given body part See flowsheet for further details and programmatic requirements.   Immunization history:  Immunization History  Administered Date(s) Administered   Influenza,inj,Quad PF,6+ Mos 04/11/2020     The following portions of the patient's history were reviewed and updated as appropriate: allergies, current medications, past medical history, past social  history, past surgical history and problem list.  Objective:  There were no vitals filed for this visit.  Physical Exam Vitals and nursing note reviewed.  Constitutional:      Appearance: Normal appearance.  HENT:     Head: Normocephalic and atraumatic.     Mouth/Throat:     Mouth: Mucous membranes are moist.     Pharynx: Oropharynx is clear. No oropharyngeal exudate or posterior oropharyngeal erythema.  Pulmonary:     Effort: Pulmonary effort is normal.  Abdominal:     General: Abdomen is flat.     Palpations: There is no mass.     Tenderness: There is no abdominal tenderness. There is no rebound.  Genitourinary:    General: Normal vulva.     Exam position: Lithotomy position.     Pubic Area: No rash or pubic lice.      Labia:        Right: No rash or lesion.        Left: No rash or lesion.      Vagina: Normal. No vaginal discharge, erythema, bleeding or lesions.     Cervix: No cervical motion tenderness, discharge, friability, lesion or erythema.     Uterus: Normal. Tender.      Adnexa:        Right: Tenderness present.        Left: Tenderness present.      Rectum: Normal.     Comments: pH<4.5 Bimanual exam with tenderness on palpation Lymphadenopathy:     Head:     Right side of head: No preauricular or posterior auricular adenopathy.  Left side of head: No preauricular or posterior auricular adenopathy.     Cervical: No cervical adenopathy.     Upper Body:     Right upper body: No supraclavicular, axillary or epitrochlear adenopathy.     Left upper body: No supraclavicular, axillary or epitrochlear adenopathy.     Lower Body: No right inguinal adenopathy. No left inguinal adenopathy.  Skin:    General: Skin is warm and dry.     Findings: No rash.  Neurological:     Mental Status: She is alert and oriented to person, place, and time.    Assessment and Plan:  Renee Gray is a 62 y.o. female presenting to the [facility] Department for STI  screening  1. Screening for venereal disease Will contact with positive results Use condoms for all sex  - Chlamydia/Gonorrhea [place] [facility] - Syphilis Serology, [place] [facility] - HIV [facility] - WET PREP FOR [place], YEAST, CLUE - Gonococcus culture  2. Vulvovaginal candidiasis Clotrimazole Vaginal Cream 1%- 1 app per vag qhs x 7 nights, 45g Discussed perineal hygiene Abstain from sex for 10-14 days after treatment  Patient accepted all screenings including oral, vaginal CT/GC and bloodwork for HIV/RPR, and wet prep. Patient meets criteria for HepB screening? No. Ordered? no Patient meets criteria for HepC screening? No. Ordered? no  Treat wet prep per standing order Discussed time line for [facility] results and that patient will be called with positive results and encouraged patient to call if she had not heard in 2 weeks.  Counseled to return or seek care for continued or worsening symptoms Recommended repeat testing in 3 months with positive results. Recommended condom use with all sex  Patient is currently using nothing to prevent pregnancy.    Return if symptoms worsen or fail to improve.  Future Appointments  Date Time Provider [place]  08/17/2022  3:00 PM Truett Mcfarlan, Deliah Goody, FNP [facility] None    Marline Backbone, FNP

## 2022-08-22 LAB — GONOCOCCUS CULTURE

## 2022-09-04 DIAGNOSIS — S39012A Strain of muscle, fascia and tendon of lower back, initial encounter: Secondary | ICD-10-CM | POA: Diagnosis not present

## 2022-11-26 ENCOUNTER — Encounter: Payer: Self-pay | Admitting: Family

## 2022-11-26 ENCOUNTER — Ambulatory Visit (INDEPENDENT_AMBULATORY_CARE_PROVIDER_SITE_OTHER): Payer: 59 | Admitting: Family

## 2022-11-26 DIAGNOSIS — R5383 Other fatigue: Secondary | ICD-10-CM

## 2022-11-26 DIAGNOSIS — E559 Vitamin D deficiency, unspecified: Secondary | ICD-10-CM

## 2022-11-26 DIAGNOSIS — E538 Deficiency of other specified B group vitamins: Secondary | ICD-10-CM | POA: Diagnosis not present

## 2022-11-26 DIAGNOSIS — K219 Gastro-esophageal reflux disease without esophagitis: Secondary | ICD-10-CM

## 2022-11-26 DIAGNOSIS — E782 Mixed hyperlipidemia: Secondary | ICD-10-CM | POA: Diagnosis not present

## 2022-11-26 DIAGNOSIS — R7303 Prediabetes: Secondary | ICD-10-CM | POA: Diagnosis not present

## 2022-11-26 MED ORDER — VITAMIN D (ERGOCALCIFEROL) 1.25 MG (50000 UNIT) PO CAPS: 50000.0000 [IU] | ORAL_CAPSULE | ORAL | 1 refills | Status: DC

## 2022-11-26 MED ORDER — ROSUVASTATIN CALCIUM 10 MG PO TABS: 10.0000 mg | ORAL_TABLET | Freq: Every day | ORAL | 3 refills | Status: DC

## 2022-11-26 MED ORDER — AMLODIPINE BESYLATE 5 MG PO TABS: 5.0000 mg | ORAL_TABLET | Freq: Every day | ORAL | 1 refills | Status: DC

## 2022-11-26 MED ORDER — OMEPRAZOLE 20 MG PO CPDR: 40.0000 mg | DELAYED_RELEASE_CAPSULE | Freq: Two times a day (BID) | ORAL | 1 refills | Status: DC

## 2022-11-27 LAB — CMP14+EGFR
ALT: 16 IU/L (ref 0–32)
AST: 19 IU/L (ref 0–40)
Albumin/Globulin Ratio: 1.4
Albumin: 4.1 g/dL (ref 3.9–4.9)
Alkaline Phosphatase: 112 IU/L (ref 44–121)
BUN/Creatinine Ratio: 12 (ref 12–28)
BUN: 10 mg/dL (ref 8–27)
Bilirubin Total: 0.4 mg/dL (ref 0.0–1.2)
CO2: 24 mmol/L (ref 20–29)
Calcium: 9.5 mg/dL (ref 8.7–10.3)
Chloride: 106 mmol/L (ref 96–106)
Creatinine, Ser: 0.83 mg/dL (ref 0.57–1.00)
Globulin, Total: 2.9 g/dL (ref 1.5–4.5)
Glucose: 75 mg/dL (ref 70–99)
Potassium: 3.9 mmol/L (ref 3.5–5.2)
Sodium: 143 mmol/L (ref 134–144)
Total Protein: 7 g/dL (ref 6.0–8.5)
eGFR: 79 mL/min/{1.73_m2} (ref >59)

## 2022-11-27 LAB — LIPID PANEL
Chol/HDL Ratio: 3.1 ratio (ref 0.0–4.4)
Cholesterol, Total: 215 mg/dL — ABNORMAL HIGH (ref 100–199)
HDL: 70 mg/dL (ref >39)
LDL Chol Calc (NIH): 117 mg/dL — ABNORMAL HIGH (ref 0–99)
Triglycerides: 164 mg/dL — ABNORMAL HIGH (ref 0–149)
VLDL Cholesterol Cal: 28 mg/dL (ref 5–40)

## 2022-11-27 LAB — CBC WITH DIFFERENTIAL
Basophils Absolute: 0 10*3/uL (ref 0.0–0.2)
Basos: 1 %
EOS (ABSOLUTE): 0.1 10*3/uL (ref 0.0–0.4)
Eos: 2 %
Hematocrit: 37.7 % (ref 34.0–46.6)
Hemoglobin: 12 g/dL (ref 11.1–15.9)
Immature Grans (Abs): 0 10*3/uL (ref 0.0–0.1)
Immature Granulocytes: 0 %
Lymphocytes Absolute: 1.5 10*3/uL (ref 0.7–3.1)
Lymphs: 32 %
MCH: 28.3 pg (ref 26.6–33.0)
MCHC: 31.8 g/dL (ref 31.5–35.7)
MCV: 89 fL (ref 79–97)
Monocytes Absolute: 0.4 10*3/uL (ref 0.1–0.9)
Monocytes: 8 %
Neutrophils Absolute: 2.7 10*3/uL (ref 1.4–7.0)
Neutrophils: 57 %
RBC: 4.24 x10E6/uL (ref 3.77–5.28)
RDW: 13.2 % (ref 11.7–15.4)
WBC: 4.8 10*3/uL (ref 3.4–10.8)

## 2022-11-27 LAB — VITAMIN D 25 HYDROXY (VIT D DEFICIENCY, FRACTURES): Vit D, 25-Hydroxy: 19.7 ng/mL — ABNORMAL LOW (ref 30.0–100.0)

## 2022-11-27 LAB — TSH: TSH: 3.63 u[IU]/mL (ref 0.450–4.500)

## 2022-11-27 LAB — VITAMIN B12: Vitamin B-12: 945 pg/mL (ref 232–1245)

## 2022-11-27 LAB — HEMOGLOBIN A1C
Est. average glucose Bld gHb Est-mCnc: 126 mg/dL
Hgb A1c MFr Bld: 6 % — ABNORMAL HIGH (ref 4.8–5.6)

## 2022-11-30 ENCOUNTER — Telehealth: Payer: Self-pay | Admitting: Family

## 2022-11-30 NOTE — Telephone Encounter (Signed)
Patient left VM wanting to know where her Rx's were sent to last because she went to [facility] and [facility] and they weren't there.

## 2022-12-01 ENCOUNTER — Other Ambulatory Visit: Payer: Self-pay | Admitting: Family

## 2022-12-06 ENCOUNTER — Encounter: Payer: Self-pay | Admitting: Family

## 2022-12-06 NOTE — Assessment & Plan Note (Signed)
Continue current meds.  Will adjust as needed based on results.  The patient is asked to make an attempt to improve diet and exercise patterns to aid in medical management of this problem. Addressed importance of increasing and maintaining water intake.   

## 2022-12-06 NOTE — Assessment & Plan Note (Signed)
Patient educated on foods that contain carbohydrates and the need to decrease intake.  We discussed prediabetes, and what it means and the need for strict dietary control to prevent progression to type 2 diabetes.  Advised to decrease intake of sugary drinks, including sodas, sweet tea, and some juices, and of starch and sugar heavy foods (ie., potatoes, rice, bread, pasta, desserts). She verbalizes understanding and agreement with the changes discussed today.   A1C Continues to be in prediabetic ranges.  Will reassess at follow up after next lab check.  Patient counseled on dietary choices and verbalized understanding.   

## 2022-12-06 NOTE — Progress Notes (Signed)
Established Patient Office Visit  Subjective:  Patient ID: Renee Gray, female    DOB: 01-Jan-1961  Age: 62 y.o. MRN: [id number]  Chief Complaint  Patient presents with   Follow-up    Medication refills     Patient is here today for her follow up.  She has been feeling well since last appointment.   She does not have additional concerns to discuss today.  Labs are not due today. She needs refills.   I have reviewed her active problem list, medication list, allergies, notes from last encounter, lab results for her appointment today.      No other concerns at this time.   Past Medical History:  Diagnosis Date   Abdominal bloating 10/05/2016   Last Assessment & Plan: Formatting of this note might be different from the original. Chronic GERD with vomiting, abdominal pain, and constipation for years.  Had EGD and colonoscopy at [facility] in 2014 (reports not available).  Currently complain of 'acid reflux' vomiting, intermittent hematemesis (chronic for years), abdominal 'swelling' discomfort, and constipation (with infrequent bowel movements)   Acute bilateral low back pain without sciatica 07/21/2017   Last Assessment & Plan: Formatting of this note might be different from the original. Lumbar strain Recommend heat, stretching, otc analgesics, and weight loss. Can trial flexeril. Provided home care instructions, HEP, and return precautions.   Annual physical exam 09/30/2016   Last Assessment & Plan: Formatting of this note might be different from the original. Discussed lifestyle mods and age-appropriate preventative measures. No alcohol abuse or tobacco/drug use. Pap smear and labs today. Depression screen negative. PHQ-2 Score: Clinic Collected PHQ-2 Total Score : 0   Chronic nonintractable headache 09/30/2016   Last Assessment & Plan: Formatting of this note might be different from the original. Discussed conservative mgmt with OTC analgesics PRN. Provided home care instructions  and return/ED precautions. Limited details provided. Asked her to keep a symptom diary. Differential includes sleep apnea-will eval further on f/u visit   Gastroesophageal reflux disease 07/23/2011   Formatting of this note might be different from the original. EGD 02/2013 [facility] grade C esophagitis, large hiatal hernia EGD 01/2017 nml Last Assessment & Plan: Formatting of this note might be different from the original. Well controlled with Prilosec 40 mg BID. Discussed that this medication should be taken on empty stomach. EGD 2018 normal.   GERD (gastroesophageal reflux disease)    GERD (gastroesophageal reflux disease)    Increased frequency of urination 12/08/2017   Last Assessment & Plan: Formatting of this note might be different from the original. UA negative/normal. - Recommend increasing water intake. UA w/ culture reflex ordered today.   Need for shingles vaccine 12/08/2017   Last Assessment & Plan: Formatting of this note might be different from the original. Will administer on f/u   Non-intractable vomiting with nausea 10/05/2016   Last Assessment & Plan: Formatting of this note might be different from the original. Patient claims that it was resolved a few days after last visit.   Refused influenza vaccine 07/21/2017   Last Assessment & Plan: Formatting of this note might be different from the original. Offered 07/21/2017 Counseled    No past surgical history on file.  Social History   Socioeconomic History   Marital status: Single    Spouse name: Not on file   Number of children: Not on file   Years of education: Not on file   Highest education level: Not on file  Occupational History   Not  on file  Tobacco Use   Smoking status: Never   Smokeless tobacco: Never  Substance and Sexual Activity   Alcohol use: No   Drug use: No   Sexual activity: Not on file  Other Topics Concern   Not on file  Social History Narrative   Not on file   Social Determinants of Health    Financial Resource Strain: Not on file  Food Insecurity: Not on file  Transportation Needs: Not on file  Physical Activity: Not on file  Stress: Not on file  Social Connections: Not on file  Intimate Partner Violence: Not on file    No family history on file.  No Known Allergies  Review of Systems  All other systems reviewed and are negative.      Objective:   BP 126/86   Pulse 85   Ht 5' 7.5" (1.715 m)   Wt 252 lb 9.6 oz (114.6 kg)   SpO2 94%   BMI 38.98 kg/m   Vitals:   11/26/22 1344  BP: 126/86  Pulse: 85  Height: 5' 7.5" (1.715 m)  Weight: 252 lb 9.6 oz (114.6 kg)  SpO2: 94%  BMI (Calculated): 38.96    Physical Exam Vitals and nursing note reviewed.  Constitutional:      Appearance: Normal appearance. She is normal weight.  HENT:     Head: Normocephalic.  Eyes:     Pupils: Pupils are equal, round, and reactive to light.  Cardiovascular:     Rate and Rhythm: Normal rate.  Pulmonary:     Effort: Pulmonary effort is normal.  Neurological:     Mental Status: She is alert.      Results for orders placed or performed in visit on 11/26/22  Lipid panel  Result Value Ref Range   Cholesterol, Total 215 (H) 100 - 199 mg/dL   Triglycerides 132 (H) 0 - 149 mg/dL   HDL 70 >44 mg/dL   VLDL Cholesterol Cal 28 5 - 40 mg/dL   LDL Chol Calc (NIH) 010 (H) 0 - 99 mg/dL   Chol/HDL Ratio 3.1 0.0 - 4.4 ratio  VITAMIN D 25 Hydroxy (Vit-D Deficiency, Fractures)  Result Value Ref Range   Vit D, 25-Hydroxy 19.7 (L) 30.0 - 100.0 ng/mL  CBC With Differential  Result Value Ref Range   WBC 4.8 3.4 - 10.8 x10E3/uL   RBC 4.24 3.77 - 5.28 x10E6/uL   Hemoglobin 12.0 11.1 - 15.9 g/dL   Hematocrit 27.2 53.6 - 46.6 %   MCV 89 79 - 97 fL   MCH 28.3 26.6 - 33.0 pg   MCHC 31.8 31.5 - 35.7 g/dL   RDW 64.4 03.4 - 74.2 %   Neutrophils 57 Not Estab. %   Lymphs 32 Not Estab. %   Monocytes 8 Not Estab. %   Eos 2 Not Estab. %   Basos 1 Not Estab. %   Neutrophils Absolute  2.7 1.4 - 7.0 x10E3/uL   Lymphocytes Absolute 1.5 0.7 - 3.1 x10E3/uL   Monocytes Absolute 0.4 0.1 - 0.9 x10E3/uL   EOS (ABSOLUTE) 0.1 0.0 - 0.4 x10E3/uL   Basophils Absolute 0.0 0.0 - 0.2 x10E3/uL   Immature Granulocytes 0 Not Estab. %   Immature Grans (Abs) 0.0 0.0 - 0.1 x10E3/uL  CMP14+EGFR  Result Value Ref Range   Glucose 75 70 - 99 mg/dL   BUN 10 8 - 27 mg/dL   Creatinine, Ser 5.95 0.57 - 1.00 mg/dL   eGFR 79 >63 OV/FIE/3.32   BUN/Creatinine Ratio  12 12 - 28   Sodium 143 134 - 144 mmol/L   Potassium 3.9 3.5 - 5.2 mmol/L   Chloride 106 96 - 106 mmol/L   CO2 24 20 - 29 mmol/L   Calcium 9.5 8.7 - 10.3 mg/dL   Total Protein 7.0 6.0 - 8.5 g/dL   Albumin 4.1 3.9 - 4.9 g/dL   Globulin, Total 2.9 1.5 - 4.5 g/dL   Albumin/Globulin Ratio 1.4    Bilirubin Total 0.4 0.0 - 1.2 mg/dL   Alkaline Phosphatase 112 44 - 121 IU/L   AST 19 0 - 40 IU/L   ALT 16 0 - 32 IU/L  TSH  Result Value Ref Range   TSH 3.630 0.450 - 4.500 uIU/mL  Hemoglobin A1c  Result Value Ref Range   Hgb A1c MFr Bld 6.0 (H) 4.8 - 5.6 %   Est. average glucose Bld gHb Est-mCnc 126 mg/dL  Vitamin G64  Result Value Ref Range   Vitamin B-12 945 232 - 1,245 pg/mL    Recent Results (from the past 2160 hour(s))  Lipid panel     Status: Abnormal   Collection Time: 11/26/22  2:20 PM  Result Value Ref Range   Cholesterol, Total 215 (H) 100 - 199 mg/dL   Triglycerides 403 (H) 0 - 149 mg/dL   HDL 70 >47 mg/dL   VLDL Cholesterol Cal 28 5 - 40 mg/dL   LDL Chol Calc (NIH) 425 (H) 0 - 99 mg/dL   Chol/HDL Ratio 3.1 0.0 - 4.4 ratio    Comment:                                   T. Chol/HDL Ratio                                             Men  Women                               1/2 Avg.Risk  3.4    3.3                                   Avg.Risk  5.0    4.4                                2X Avg.Risk  9.6    7.1                                3X Avg.Risk 23.4   11.0   VITAMIN D 25 Hydroxy (Vit-D Deficiency, Fractures)      Status: Abnormal   Collection Time: 11/26/22  2:20 PM  Result Value Ref Range   Vit D, 25-Hydroxy 19.7 (L) 30.0 - 100.0 ng/mL    Comment: Vitamin D deficiency has been defined by the Institute of Medicine and an Endocrine Society practice guideline as a level of serum 25-OH vitamin D less than 20 ng/mL (1,2). The Endocrine Society went on to further define vitamin D insufficiency as a level between 21 and 29 ng/mL (2). 1. IOM (Institute  of Medicine). 2010. Dietary reference    intakes for calcium and D. [place]: The    [employer]. 2. Holick MF, Binkley Ranger, Bischoff-Ferrari HA, et al.    Evaluation, treatment, and prevention of vitamin D    deficiency: an Endocrine Society clinical practice    guideline. JCEM. 2011 Jul; 96(7):1911-30.   CBC With Differential     Status: None   Collection Time: 11/26/22  2:20 PM  Result Value Ref Range   WBC 4.8 3.4 - 10.8 x10E3/uL   RBC 4.24 3.77 - 5.28 x10E6/uL   Hemoglobin 12.0 11.1 - 15.9 g/dL   Hematocrit 16.1 09.6 - 46.6 %   MCV 89 79 - 97 fL   MCH 28.3 26.6 - 33.0 pg   MCHC 31.8 31.5 - 35.7 g/dL   RDW 04.5 40.9 - 81.1 %   Neutrophils 57 Not Estab. %   Lymphs 32 Not Estab. %   Monocytes 8 Not Estab. %   Eos 2 Not Estab. %   Basos 1 Not Estab. %   Neutrophils Absolute 2.7 1.4 - 7.0 x10E3/uL   Lymphocytes Absolute 1.5 0.7 - 3.1 x10E3/uL   Monocytes Absolute 0.4 0.1 - 0.9 x10E3/uL   EOS (ABSOLUTE) 0.1 0.0 - 0.4 x10E3/uL   Basophils Absolute 0.0 0.0 - 0.2 x10E3/uL   Immature Granulocytes 0 Not Estab. %   Immature Grans (Abs) 0.0 0.0 - 0.1 x10E3/uL    Comment: **Effective January 11, 2023, profile [id number] CBC/Differential**   (No Platelet) will be made non-orderable. [facility] Offers:   [id number] CBC With Differential/Platelet   CMP14+EGFR     Status: None   Collection Time: 11/26/22  2:20 PM  Result Value Ref Range   Glucose 75 70 - 99 mg/dL   BUN 10 8 - 27 mg/dL   Creatinine, Ser 9.56 0.57 - 1.00 mg/dL   eGFR 79 >21  HY/QMV/7.84   BUN/Creatinine Ratio 12 12 - 28   Sodium 143 134 - 144 mmol/L   Potassium 3.9 3.5 - 5.2 mmol/L   Chloride 106 96 - 106 mmol/L   CO2 24 20 - 29 mmol/L   Calcium 9.5 8.7 - 10.3 mg/dL   Total Protein 7.0 6.0 - 8.5 g/dL   Albumin 4.1 3.9 - 4.9 g/dL   Globulin, Total 2.9 1.5 - 4.5 g/dL   Albumin/Globulin Ratio 1.4    Bilirubin Total 0.4 0.0 - 1.2 mg/dL   Alkaline Phosphatase 112 44 - 121 IU/L   AST 19 0 - 40 IU/L   ALT 16 0 - 32 IU/L  TSH     Status: None   Collection Time: 11/26/22  2:20 PM  Result Value Ref Range   TSH 3.630 0.450 - 4.500 uIU/mL  Hemoglobin A1c     Status: Abnormal   Collection Time: 11/26/22  2:20 PM  Result Value Ref Range   Hgb A1c MFr Bld 6.0 (H) 4.8 - 5.6 %    Comment:          Prediabetes: 5.7 - 6.4          Diabetes: >6.4          Glycemic control for adults with diabetes: <7.0    Est. average glucose Bld gHb Est-mCnc 126 mg/dL  Vitamin O96     Status: None   Collection Time: 11/26/22  2:20 PM  Result Value Ref Range   Vitamin B-12 945 232 - 1,245 pg/mL       Assessment & Plan:   Problem  List Items Addressed This Visit       Active Problems   Gastroesophageal reflux disease without esophagitis    Patient stable.  Well controlled with current therapy.   Continue current meds.       Relevant Medications   omeprazole (PRILOSEC) 20 MG capsule   Hyperlipidemia    Checking labs today.  Continue current therapy for lipid control. Will modify as needed based on labwork results.       Relevant Medications   amLODipine (NORVASC) 5 MG tablet   rosuvastatin (CRESTOR) 10 MG tablet   Other Relevant Orders   Lipid panel (Completed)   CBC With Differential (Completed)   CMP14+EGFR (Completed)   Morbid obesity (HCC) - Primary    Continue current meds.  Will adjust as needed based on results.  The patient is asked to make an attempt to improve diet and exercise patterns to aid in medical management of this problem. Addressed  importance of increasing and maintaining water intake.       Relevant Orders   CBC With Differential (Completed)   CMP14+EGFR (Completed)   Prediabetes    Patient educated on foods that contain carbohydrates and the need to decrease intake.  We discussed prediabetes, and what it means and the need for strict dietary control to prevent progression to type 2 diabetes.  Advised to decrease intake of sugary drinks, including sodas, sweet tea, and some juices, and of starch and sugar heavy foods (ie., potatoes, rice, bread, pasta, desserts). She verbalizes understanding and agreement with the changes discussed today.  A1C Continues to be in prediabetic ranges.  Will reassess at follow up after next lab check.  Patient counseled on dietary choices and verbalized understanding.        Relevant Orders   CBC With Differential (Completed)   CMP14+EGFR (Completed)   Hemoglobin A1c (Completed)   Other Visit Diagnoses     B12 deficiency due to diet       Checking labs today.  Will continue supplements as needed.   Relevant Orders   CBC With Differential (Completed)   CMP14+EGFR (Completed)   Vitamin B12 (Completed)   Vitamin D deficiency, unspecified       Checking labs today.  Will continue supplements as needed.   Relevant Orders   VITAMIN D 25 Hydroxy (Vit-D Deficiency, Fractures) (Completed)   CBC With Differential (Completed)   CMP14+EGFR (Completed)   Other fatigue       Relevant Orders   CBC With Differential (Completed)   CMP14+EGFR (Completed)   TSH (Completed)       Return in about 6 months (around 05/28/2023).   Total time spent: 20 minutes  Miki Kins, FNP  11/26/2022   This document may have been prepared by [facility] Voice Recognition software and as such may include unintentional dictation errors.

## 2022-12-06 NOTE — Assessment & Plan Note (Signed)
Checking labs today.  Continue current therapy for lipid control. Will modify as needed based on labwork results.  

## 2022-12-06 NOTE — Assessment & Plan Note (Signed)
Patient stable.  Well controlled with current therapy.   Continue current meds.  

## 2022-12-29 DIAGNOSIS — M170 Bilateral primary osteoarthritis of knee: Secondary | ICD-10-CM | POA: Diagnosis not present

## 2022-12-29 DIAGNOSIS — M25562 Pain in left knee: Secondary | ICD-10-CM | POA: Diagnosis not present

## 2022-12-29 DIAGNOSIS — M1712 Unilateral primary osteoarthritis, left knee: Secondary | ICD-10-CM | POA: Diagnosis not present

## 2022-12-31 ENCOUNTER — Other Ambulatory Visit: Payer: Self-pay | Admitting: Family

## 2022-12-31 MED ORDER — VITAMIN D (ERGOCALCIFEROL) 1.25 MG (50000 UNIT) PO CAPS: 50000.0000 [IU] | ORAL_CAPSULE | ORAL | 1 refills | Status: DC

## 2023-01-31 DIAGNOSIS — M7989 Other specified soft tissue disorders: Secondary | ICD-10-CM | POA: Diagnosis not present

## 2023-01-31 DIAGNOSIS — S8002XA Contusion of left knee, initial encounter: Secondary | ICD-10-CM | POA: Diagnosis not present

## 2023-01-31 DIAGNOSIS — I10 Essential (primary) hypertension: Secondary | ICD-10-CM | POA: Diagnosis not present

## 2023-01-31 DIAGNOSIS — Z79899 Other long term (current) drug therapy: Secondary | ICD-10-CM | POA: Diagnosis not present

## 2023-01-31 DIAGNOSIS — M25562 Pain in left knee: Secondary | ICD-10-CM | POA: Diagnosis not present

## 2023-02-19 DIAGNOSIS — M5489 Other dorsalgia: Secondary | ICD-10-CM | POA: Diagnosis not present

## 2023-02-19 DIAGNOSIS — M546 Pain in thoracic spine: Secondary | ICD-10-CM | POA: Diagnosis not present

## 2023-02-19 DIAGNOSIS — S8992XA Unspecified injury of left lower leg, initial encounter: Secondary | ICD-10-CM | POA: Diagnosis not present

## 2023-02-19 DIAGNOSIS — I10 Essential (primary) hypertension: Secondary | ICD-10-CM | POA: Diagnosis not present

## 2023-03-05 ENCOUNTER — Other Ambulatory Visit: Payer: Self-pay | Admitting: Family

## 2023-03-14 DIAGNOSIS — S39012A Strain of muscle, fascia and tendon of lower back, initial encounter: Secondary | ICD-10-CM | POA: Diagnosis not present

## 2023-05-27 ENCOUNTER — Ambulatory Visit: Payer: Medicaid Other | Admitting: Family

## 2023-06-03 ENCOUNTER — Other Ambulatory Visit: Payer: Self-pay | Admitting: Family

## 2023-06-28 ENCOUNTER — Ambulatory Visit: Payer: BLUE CROSS/BLUE SHIELD | Admitting: Family

## 2023-06-28 ENCOUNTER — Encounter: Payer: Self-pay | Admitting: Family

## 2023-06-28 VITALS — BP 110/80 | HR 84 | Ht 67.5 in | Wt 264.6 lb

## 2023-06-28 DIAGNOSIS — Z1211 Encounter for screening for malignant neoplasm of colon: Secondary | ICD-10-CM | POA: Diagnosis not present

## 2023-06-28 DIAGNOSIS — R1084 Generalized abdominal pain: Secondary | ICD-10-CM

## 2023-06-28 DIAGNOSIS — E538 Deficiency of other specified B group vitamins: Secondary | ICD-10-CM

## 2023-06-28 DIAGNOSIS — E559 Vitamin D deficiency, unspecified: Secondary | ICD-10-CM | POA: Diagnosis not present

## 2023-06-28 DIAGNOSIS — R1319 Other dysphagia: Secondary | ICD-10-CM

## 2023-06-28 DIAGNOSIS — R7303 Prediabetes: Secondary | ICD-10-CM

## 2023-06-28 DIAGNOSIS — R5383 Other fatigue: Secondary | ICD-10-CM

## 2023-06-28 DIAGNOSIS — E782 Mixed hyperlipidemia: Secondary | ICD-10-CM

## 2023-06-28 DIAGNOSIS — Z0130 Encounter for examination of blood pressure without abnormal findings: Secondary | ICD-10-CM

## 2023-06-28 MED ORDER — VOQUEZNA 20 MG PO TABS: 20.0000 mg | ORAL_TABLET | Freq: Every day | ORAL | 1 refills | Status: DC

## 2023-06-28 NOTE — Progress Notes (Signed)
 Established Patient Office Visit  Subjective:  Patient ID: Renee Gray, female    DOB: 1960/07/15  Age: 63 y.o. MRN: [id number]  Chief Complaint  Patient presents with   Follow-up    Follow up    Patient is here today for follow up.  She has been feeling fairly well since last appointment.   She does have additional concerns to discuss today.  Weight loss - wants to know if there is something we can do for this.  Blood Pressure - has been low, patient has not been taking every day.   Abdominal Pain - happens when she eats.  Also feels like food gets stuck on the way down.   Having a lot of bloating as well.   Labs are due today. She needs refills.   I have reviewed her active problem list, medication list, allergies, health maintenance, notes from last encounter, lab results for her appointment today.      No other concerns at this time.   Past Medical History:  Diagnosis Date   Abdominal bloating 10/05/2016   Last Assessment & Plan: Formatting of this note might be different from the original. Chronic GERD with vomiting, abdominal pain, and constipation for years.  Had EGD and colonoscopy at [facility] in 2014 (reports not available).  Currently complain of 'acid reflux' vomiting, intermittent hematemesis (chronic for years), abdominal 'swelling' discomfort, and constipation (with infrequent bowel movements)   Acute bilateral low back pain without sciatica 07/21/2017   Last Assessment & Plan: Formatting of this note might be different from the original. Lumbar strain Recommend heat, stretching, otc analgesics, and weight loss. Can trial flexeril . Provided home care instructions, HEP, and return precautions.   Annual physical exam 09/30/2016   Last Assessment & Plan: Formatting of this note might be different from the original. Discussed lifestyle mods and age-appropriate preventative measures. No alcohol abuse or tobacco/drug use. Pap smear and labs today. Depression screen  negative. PHQ-2 Score: Clinic Collected PHQ-2 Total Score : 0   Chronic nonintractable headache 09/30/2016   Last Assessment & Plan: Formatting of this note might be different from the original. Discussed conservative mgmt with OTC analgesics PRN. Provided home care instructions and return/ED precautions. Limited details provided. Asked her to keep a symptom diary. Differential includes sleep apnea-will eval further on f/u visit   Gastroesophageal reflux disease 07/23/2011   Formatting of this note might be different from the original. EGD 02/2013 [facility] grade C esophagitis, large hiatal hernia EGD 01/2017 nml Last Assessment & Plan: Formatting of this note might be different from the original. Well controlled with Prilosec 40 mg BID. Discussed that this medication should be taken on empty stomach. EGD 2018 normal.   GERD (gastroesophageal reflux disease)    GERD (gastroesophageal reflux disease)    Increased frequency of urination 12/08/2017   Last Assessment & Plan: Formatting of this note might be different from the original. UA negative/normal. - Recommend increasing water intake. UA w/ culture reflex ordered today.   Need for shingles vaccine 12/08/2017   Last Assessment & Plan: Formatting of this note might be different from the original. Will administer on f/u   Non-intractable vomiting with nausea 10/05/2016   Last Assessment & Plan: Formatting of this note might be different from the original. Patient claims that it was resolved a few days after last visit.   Refused influenza vaccine 07/21/2017   Last Assessment & Plan: Formatting of this note might be different from the original. Offered 07/21/2017 Counseled  No past surgical history on file.  Social History   Socioeconomic History   Marital status: Single    Spouse name: Not on file   Number of children: Not on file   Years of education: Not on file   Highest education level: Not on file  Occupational History   Not on file   Tobacco Use   Smoking status: Never   Smokeless tobacco: Never  Substance and Sexual Activity   Alcohol use: No   Drug use: No   Sexual activity: Not on file  Other Topics Concern   Not on file  Social History Narrative   Not on file   Social Drivers of Health   Financial Resource Strain: Not on file  Food Insecurity: Not on file  Transportation Needs: Not on file  Physical Activity: Not on file  Stress: Not on file  Social Connections: Not on file  Intimate Partner Violence: Not on file    No family history on file.  No Known Allergies  Review of Systems  Gastrointestinal:  Positive for abdominal pain (with bloating, after eating.).       Dysphagia  All other systems reviewed and are negative.      Objective:   BP 110/80   Pulse 84   Ht 5' 7.5 (1.715 m)   Wt 264 lb 9.6 oz (120 kg)   SpO2 96%   BMI 40.83 kg/m   Vitals:   06/28/23 1457  BP: 110/80  Pulse: 84  Height: 5' 7.5 (1.715 m)  Weight: 264 lb 9.6 oz (120 kg)  SpO2: 96%  BMI (Calculated): 40.81    Physical Exam Vitals and nursing note reviewed.  Constitutional:      Appearance: Normal appearance. She is normal weight.  HENT:     Head: Normocephalic.  Eyes:     Extraocular Movements: Extraocular movements intact.     Conjunctiva/sclera: Conjunctivae normal.     Pupils: Pupils are equal, round, and reactive to light.  Cardiovascular:     Rate and Rhythm: Normal rate.  Pulmonary:     Effort: Pulmonary effort is normal.  Abdominal:     Tenderness: There is abdominal tenderness.  Neurological:     General: No focal deficit present.     Mental Status: She is alert and oriented to person, place, and time. Mental status is at baseline.  Psychiatric:        Mood and Affect: Mood normal.        Behavior: Behavior normal.        Thought Content: Thought content normal.        Judgment: Judgment normal.      Results for orders placed or performed in visit on 06/28/23  Iron, TIBC and  Ferritin Panel  Result Value Ref Range   Total Iron Binding Capacity 358 250 - 450 ug/dL   UIBC 724 881 - 630 ug/dL   Iron 83 27 - 860 ug/dL   Iron Saturation 23 15 - 55 %   Ferritin 31 15 - 150 ng/mL  Lipid panel  Result Value Ref Range   Cholesterol, Total 159 100 - 199 mg/dL   Triglycerides 95 0 - 149 mg/dL   HDL 75 >60 mg/dL   VLDL Cholesterol Cal 17 5 - 40 mg/dL   LDL Chol Calc (NIH) 67 0 - 99 mg/dL   Chol/HDL Ratio 2.1 0.0 - 4.4 ratio  VITAMIN D  25 Hydroxy (Vit-D Deficiency, Fractures)  Result Value Ref Range   Vit D,  25-Hydroxy 36.5 30.0 - 100.0 ng/mL  CMP14+EGFR  Result Value Ref Range   Glucose 92 70 - 99 mg/dL   BUN 13 8 - 27 mg/dL   Creatinine, Ser 9.28 0.57 - 1.00 mg/dL   eGFR 95 >40 fO/fpw/8.26   BUN/Creatinine Ratio 18 12 - 28   Sodium 141 134 - 144 mmol/L   Potassium 3.7 3.5 - 5.2 mmol/L   Chloride 103 96 - 106 mmol/L   CO2 23 20 - 29 mmol/L   Calcium  9.2 8.7 - 10.3 mg/dL   Total Protein 7.3 6.0 - 8.5 g/dL   Albumin 4.2 3.9 - 4.9 g/dL   Globulin, Total 3.1 1.5 - 4.5 g/dL   Bilirubin Total 0.4 0.0 - 1.2 mg/dL   Alkaline Phosphatase 113 44 - 121 IU/L   AST 19 0 - 40 IU/L   ALT 18 0 - 32 IU/L  TSH  Result Value Ref Range   TSH 4.010 0.450 - 4.500 uIU/mL  Hemoglobin A1c  Result Value Ref Range   Hgb A1c MFr Bld 5.9 (H) 4.8 - 5.6 %   Est. average glucose Bld gHb Est-mCnc 123 mg/dL  Vitamin A87  Result Value Ref Range   Vitamin B-12 876 232 - 1,245 pg/mL  CBC with Diff  Result Value Ref Range   WBC 5.0 3.4 - 10.8 x10E3/uL   RBC 4.17 3.77 - 5.28 x10E6/uL   Hemoglobin 11.8 11.1 - 15.9 g/dL   Hematocrit 61.7 65.9 - 46.6 %   MCV 92 79 - 97 fL   MCH 28.3 26.6 - 33.0 pg   MCHC 30.9 (L) 31.5 - 35.7 g/dL   RDW 86.8 88.2 - 84.5 %   Platelets 203 150 - 450 x10E3/uL   Neutrophils 57 Not Estab. %   Lymphs 33 Not Estab. %   Monocytes 8 Not Estab. %   Eos 2 Not Estab. %   Basos 0 Not Estab. %   Neutrophils Absolute 2.8 1.4 - 7.0 x10E3/uL   Lymphocytes  Absolute 1.6 0.7 - 3.1 x10E3/uL   Monocytes Absolute 0.4 0.1 - 0.9 x10E3/uL   EOS (ABSOLUTE) 0.1 0.0 - 0.4 x10E3/uL   Basophils Absolute 0.0 0.0 - 0.2 x10E3/uL   Immature Granulocytes 0 Not Estab. %   Immature Grans (Abs) 0.0 0.0 - 0.1 x10E3/uL    Recent Results (from the past 2160 hours)  Iron, TIBC and Ferritin Panel     Status: None   Collection Time: 06/28/23  3:45 PM  Result Value Ref Range   Total Iron Binding Capacity 358 250 - 450 ug/dL   UIBC 724 881 - 630 ug/dL   Iron 83 27 - 860 ug/dL   Iron Saturation 23 15 - 55 %   Ferritin 31 15 - 150 ng/mL  Lipid panel     Status: None   Collection Time: 06/28/23  3:45 PM  Result Value Ref Range   Cholesterol, Total 159 100 - 199 mg/dL   Triglycerides 95 0 - 149 mg/dL   HDL 75 >60 mg/dL   VLDL Cholesterol Cal 17 5 - 40 mg/dL   LDL Chol Calc (NIH) 67 0 - 99 mg/dL   Chol/HDL Ratio 2.1 0.0 - 4.4 ratio    Comment:                                   T. Chol/HDL Ratio  Men  Women                               1/2 Avg.Risk  3.4    3.3                                   Avg.Risk  5.0    4.4                                2X Avg.Risk  9.6    7.1                                3X Avg.Risk 23.4   11.0   VITAMIN D  25 Hydroxy (Vit-D Deficiency, Fractures)     Status: None   Collection Time: 06/28/23  3:45 PM  Result Value Ref Range   Vit D, 25-Hydroxy 36.5 30.0 - 100.0 ng/mL    Comment: Vitamin D  deficiency has been defined by the Institute of Medicine and an Endocrine Society practice guideline as a level of serum 25-OH vitamin D  less than 20 ng/mL (1,2). The Endocrine Society went on to further define vitamin D  insufficiency as a level between 21 and 29 ng/mL (2). 1. IOM (Institute of Medicine). 2010. Dietary reference    intakes for calcium  and D. [place]: The    [employer]. 2. Holick MF, Binkley [place], Bischoff-Ferrari HA, et al.    Evaluation, treatment, and prevention of  vitamin D     deficiency: an Endocrine Society clinical practice    guideline. JCEM. 2011 Jul; 96(7):1911-30.   CMP14+EGFR     Status: None   Collection Time: 06/28/23  3:45 PM  Result Value Ref Range   Glucose 92 70 - 99 mg/dL   BUN 13 8 - 27 mg/dL   Creatinine, Ser 9.28 0.57 - 1.00 mg/dL   eGFR 95 >40 fO/fpw/8.26   BUN/Creatinine Ratio 18 12 - 28   Sodium 141 134 - 144 mmol/L   Potassium 3.7 3.5 - 5.2 mmol/L   Chloride 103 96 - 106 mmol/L   CO2 23 20 - 29 mmol/L   Calcium  9.2 8.7 - 10.3 mg/dL   Total Protein 7.3 6.0 - 8.5 g/dL   Albumin 4.2 3.9 - 4.9 g/dL   Globulin, Total 3.1 1.5 - 4.5 g/dL   Bilirubin Total 0.4 0.0 - 1.2 mg/dL   Alkaline Phosphatase 113 44 - 121 IU/L   AST 19 0 - 40 IU/L   ALT 18 0 - 32 IU/L  TSH     Status: None   Collection Time: 06/28/23  3:45 PM  Result Value Ref Range   TSH 4.010 0.450 - 4.500 uIU/mL  Hemoglobin A1c     Status: Abnormal   Collection Time: 06/28/23  3:45 PM  Result Value Ref Range   Hgb A1c MFr Bld 5.9 (H) 4.8 - 5.6 %    Comment:          Prediabetes: 5.7 - 6.4          Diabetes: >6.4          Glycemic control for adults with diabetes: <7.0    Est. average glucose Bld gHb Est-mCnc 123 mg/dL  Vitamin B12     Status: None  Collection Time: 06/28/23  3:45 PM  Result Value Ref Range   Vitamin B-12 876 232 - 1,245 pg/mL  CBC with Diff     Status: Abnormal   Collection Time: 06/28/23  3:45 PM  Result Value Ref Range   WBC 5.0 3.4 - 10.8 x10E3/uL   RBC 4.17 3.77 - 5.28 x10E6/uL   Hemoglobin 11.8 11.1 - 15.9 g/dL   Hematocrit 61.7 65.9 - 46.6 %   MCV 92 79 - 97 fL   MCH 28.3 26.6 - 33.0 pg   MCHC 30.9 (L) 31.5 - 35.7 g/dL   RDW 86.8 88.2 - 84.5 %   Platelets 203 150 - 450 x10E3/uL   Neutrophils 57 Not Estab. %   Lymphs 33 Not Estab. %   Monocytes 8 Not Estab. %   Eos 2 Not Estab. %   Basos 0 Not Estab. %   Neutrophils Absolute 2.8 1.4 - 7.0 x10E3/uL   Lymphocytes Absolute 1.6 0.7 - 3.1 x10E3/uL   Monocytes Absolute 0.4 0.1  - 0.9 x10E3/uL   EOS (ABSOLUTE) 0.1 0.0 - 0.4 x10E3/uL   Basophils Absolute 0.0 0.0 - 0.2 x10E3/uL   Immature Granulocytes 0 Not Estab. %   Immature Grans (Abs) 0.0 0.0 - 0.1 x10E3/uL       Assessment & Plan:   Problem List Items Addressed This Visit       Digestive   Dysphagia   Sending referral to GI for patient.  Have asked that she let them know if she still has symptoms when she sees them.   Giving her Voquezna  samples in the interim. She will let me know if these work so I can send RX.          Other   Hyperlipidemia - Primary   Checking labs today.  Continue current therapy for lipid control. Will modify as needed based on labwork results.        Relevant Orders   Lipid panel (Completed)   CMP14+EGFR (Completed)   CBC with Diff (Completed)   Morbid obesity (HCC)   Patient is willing to consider Wegovy . Will adjust as needed based on results.  The patient is asked to make an attempt to improve diet and exercise patterns to aid in medical management of this problem. Addressed importance of increasing and maintaining water intake.        Relevant Orders   CMP14+EGFR (Completed)   CBC with Diff (Completed)   Prediabetes   Patient educated on foods that contain carbohydrates and the need to decrease intake.  We discussed prediabetes, and what it means and the need for strict dietary control to prevent progression to type 2 diabetes.  Advised to decrease intake of sugary drinks, including sodas, sweet tea, and some juices, and of starch and sugar heavy foods (ie., potatoes, rice, bread, pasta, desserts). She verbalizes understanding and agreement with the changes discussed today.  A1C Continues to be in prediabetic ranges.  Will reassess at follow up after next lab check.  Patient counseled on dietary choices and verbalized understanding.        Relevant Orders   CMP14+EGFR (Completed)   Hemoglobin A1c (Completed)   CBC with Diff (Completed)   Generalized  abdominal pain   Relevant Orders   US  Abdomen Complete   Other Visit Diagnoses       B12 deficiency due to diet       Checking labs today.  Will continue supplements as needed.   Relevant Orders   CMP14+EGFR (  Completed)   Vitamin B12 (Completed)   CBC with Diff (Completed)     Vitamin D  deficiency, unspecified       Checking labs today.  Will continue supplements as needed.   Relevant Orders   VITAMIN D  25 Hydroxy (Vit-D Deficiency, Fractures) (Completed)   CMP14+EGFR (Completed)   CBC with Diff (Completed)     Other fatigue       Relevant Orders   Iron, TIBC and Ferritin Panel (Completed)   CMP14+EGFR (Completed)   TSH (Completed)   CBC with Diff (Completed)     Colon cancer screening       Sending referral to GI for colonoscopy.   Relevant Orders   Ambulatory referral to Gastroenterology       Return in about 3 months (around 09/26/2023).   Total time spent: 20 minutes  ALAN CHRISTELLA ARRANT, FNP  06/28/2023   This document may have been prepared by [facility] Voice Recognition software and as such may include unintentional dictation errors.

## 2023-06-29 LAB — CBC WITH DIFFERENTIAL/PLATELET
Basophils Absolute: 0 10*3/uL (ref 0.0–0.2)
Basos: 0 %
EOS (ABSOLUTE): 0.1 10*3/uL (ref 0.0–0.4)
Eos: 2 %
Hematocrit: 38.2 % (ref 34.0–46.6)
Hemoglobin: 11.8 g/dL (ref 11.1–15.9)
Immature Grans (Abs): 0 10*3/uL (ref 0.0–0.1)
Immature Granulocytes: 0 %
Lymphocytes Absolute: 1.6 10*3/uL (ref 0.7–3.1)
Lymphs: 33 %
MCH: 28.3 pg (ref 26.6–33.0)
MCHC: 30.9 g/dL — ABNORMAL LOW (ref 31.5–35.7)
MCV: 92 fL (ref 79–97)
Monocytes Absolute: 0.4 10*3/uL (ref 0.1–0.9)
Monocytes: 8 %
Neutrophils Absolute: 2.8 10*3/uL (ref 1.4–7.0)
Neutrophils: 57 %
Platelets: 203 10*3/uL (ref 150–450)
RBC: 4.17 x10E6/uL (ref 3.77–5.28)
RDW: 13.1 % (ref 11.7–15.4)
WBC: 5 10*3/uL (ref 3.4–10.8)

## 2023-06-29 LAB — LIPID PANEL
Chol/HDL Ratio: 2.1 {ratio} (ref 0.0–4.4)
Cholesterol, Total: 159 mg/dL (ref 100–199)
HDL: 75 mg/dL (ref >39)
LDL Chol Calc (NIH): 67 mg/dL (ref 0–99)
Triglycerides: 95 mg/dL (ref 0–149)
VLDL Cholesterol Cal: 17 mg/dL (ref 5–40)

## 2023-06-29 LAB — CMP14+EGFR
ALT: 18 [IU]/L (ref 0–32)
AST: 19 [IU]/L (ref 0–40)
Albumin: 4.2 g/dL (ref 3.9–4.9)
Alkaline Phosphatase: 113 [IU]/L (ref 44–121)
BUN/Creatinine Ratio: 18 (ref 12–28)
BUN: 13 mg/dL (ref 8–27)
Bilirubin Total: 0.4 mg/dL (ref 0.0–1.2)
CO2: 23 mmol/L (ref 20–29)
Calcium: 9.2 mg/dL (ref 8.7–10.3)
Chloride: 103 mmol/L (ref 96–106)
Creatinine, Ser: 0.71 mg/dL (ref 0.57–1.00)
Globulin, Total: 3.1 g/dL (ref 1.5–4.5)
Glucose: 92 mg/dL (ref 70–99)
Potassium: 3.7 mmol/L (ref 3.5–5.2)
Sodium: 141 mmol/L (ref 134–144)
Total Protein: 7.3 g/dL (ref 6.0–8.5)
eGFR: 95 mL/min/{1.73_m2} (ref >59)

## 2023-06-29 LAB — TSH: TSH: 4.01 u[IU]/mL (ref 0.450–4.500)

## 2023-06-29 LAB — IRON,TIBC AND FERRITIN PANEL
Ferritin: 31 ng/mL (ref 15–150)
Iron Saturation: 23 % (ref 15–55)
Iron: 83 ug/dL (ref 27–139)
Total Iron Binding Capacity: 358 ug/dL (ref 250–450)
UIBC: 275 ug/dL (ref 118–369)

## 2023-06-29 LAB — HEMOGLOBIN A1C
Est. average glucose Bld gHb Est-mCnc: 123 mg/dL
Hgb A1c MFr Bld: 5.9 % — ABNORMAL HIGH (ref 4.8–5.6)

## 2023-06-29 LAB — VITAMIN B12: Vitamin B-12: 876 pg/mL (ref 232–1245)

## 2023-06-29 LAB — VITAMIN D 25 HYDROXY (VIT D DEFICIENCY, FRACTURES): Vit D, 25-Hydroxy: 36.5 ng/mL (ref 30.0–100.0)

## 2023-06-30 ENCOUNTER — Other Ambulatory Visit: Payer: Self-pay | Admitting: Family

## 2023-07-03 ENCOUNTER — Other Ambulatory Visit: Payer: Self-pay | Admitting: Family

## 2023-07-03 ENCOUNTER — Encounter: Payer: Self-pay | Admitting: Family

## 2023-07-04 NOTE — Assessment & Plan Note (Signed)
Checking labs today.  Continue current therapy for lipid control. Will modify as needed based on labwork results.  

## 2023-07-04 NOTE — Assessment & Plan Note (Signed)
Patient is willing to consider Wegovy. Will adjust as needed based on results.  The patient is asked to make an attempt to improve diet and exercise patterns to aid in medical management of this problem. Addressed importance of increasing and maintaining water intake.

## 2023-07-04 NOTE — Assessment & Plan Note (Signed)
Sending referral to GI for patient.  Have asked that she let them know if she still has symptoms when she sees them.   Giving her Voquezna samples in the interim. She will let me know if these work so I can send RX.

## 2023-07-04 NOTE — Assessment & Plan Note (Signed)

## 2023-07-05 DIAGNOSIS — M545 Low back pain, unspecified: Secondary | ICD-10-CM | POA: Diagnosis not present

## 2023-07-16 ENCOUNTER — Ambulatory Visit (LOCAL_COMMUNITY_HEALTH_CENTER): Payer: Self-pay

## 2023-07-16 DIAGNOSIS — Z9289 Personal history of other medical treatment: Secondary | ICD-10-CM

## 2023-07-16 DIAGNOSIS — Z111 Encounter for screening for respiratory tuberculosis: Secondary | ICD-10-CM

## 2023-07-16 NOTE — Progress Notes (Signed)
In nurse clinic for TB screening.  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  TB screening form completed and given to patient. Jerel Shepherd, RN

## 2023-07-23 DIAGNOSIS — M25562 Pain in left knee: Secondary | ICD-10-CM | POA: Diagnosis not present

## 2023-07-23 DIAGNOSIS — M1712 Unilateral primary osteoarthritis, left knee: Secondary | ICD-10-CM | POA: Diagnosis not present

## 2023-07-29 ENCOUNTER — Ambulatory Visit: Payer: Medicaid Other | Admitting: Family

## 2023-08-26 ENCOUNTER — Inpatient Hospital Stay: Admission: RE | Admit: 2023-08-26 | Source: Ambulatory Visit

## 2023-08-26 ENCOUNTER — Other Ambulatory Visit

## 2023-09-02 ENCOUNTER — Ambulatory Visit
Admission: RE | Admit: 2023-09-02 | Discharge: 2023-09-02 | Disposition: A | Discharge status: Home / Self Care | Source: Ambulatory Visit | Attending: Family | Admitting: Family

## 2023-09-02 DIAGNOSIS — R1084 Generalized abdominal pain: Secondary | ICD-10-CM

## 2023-09-02 DIAGNOSIS — R109 Unspecified abdominal pain: Secondary | ICD-10-CM | POA: Diagnosis not present

## 2023-09-02 DIAGNOSIS — K769 Liver disease, unspecified: Secondary | ICD-10-CM | POA: Diagnosis not present

## 2023-09-10 NOTE — Progress Notes (Signed)
 Informed via Mychart message

## 2023-09-13 ENCOUNTER — Encounter: Payer: Self-pay | Admitting: Family

## 2023-09-13 ENCOUNTER — Ambulatory Visit (INDEPENDENT_AMBULATORY_CARE_PROVIDER_SITE_OTHER): Admitting: Family

## 2023-09-13 VITALS — BP 120/86 | HR 90 | Ht 67.5 in | Wt 264.4 lb

## 2023-09-13 DIAGNOSIS — G4719 Other hypersomnia: Secondary | ICD-10-CM

## 2023-09-13 DIAGNOSIS — E559 Vitamin D deficiency, unspecified: Secondary | ICD-10-CM

## 2023-09-13 DIAGNOSIS — R7303 Prediabetes: Secondary | ICD-10-CM

## 2023-09-13 DIAGNOSIS — R1011 Right upper quadrant pain: Secondary | ICD-10-CM

## 2023-09-13 DIAGNOSIS — Z0130 Encounter for examination of blood pressure without abnormal findings: Secondary | ICD-10-CM

## 2023-09-13 MED ORDER — VITAMIN D (ERGOCALCIFEROL) 1.25 MG (50000 UNIT) PO CAPS: 50000.0000 [IU] | ORAL_CAPSULE | ORAL | 1 refills | Status: AC

## 2023-09-13 NOTE — Progress Notes (Signed)
 Established Patient Office Visit  Subjective:  Patient ID: Renee Gray, female    DOB: 09-Apr-1961  Age: 63 y.o. MRN: [id number]  Chief Complaint  Patient presents with   Follow-up    Patient is here today for her  follow up.  She has been feeling fairly well since last appointment.   She does have additional concerns to discuss today.  1) [place] results: Within Normal Limits, no abnormal findings. Will set up for GI referral, since we can't find  2) Weight loss: Asks if there is anything we can try for this.  3) Vitamin D rx: Says that the Vitamin D RX has been too expensive for her to afford at her pharmacy.  4) Excessive Daytime Sleepiness: Asks if we can get a sleep study.   Labs are not due today. She needs refills.   I have reviewed her active problem list, medication list, allergies, health maintenance, notes from last encounter, lab results for her appointment today.      No other concerns at this time.   Past Medical History:  Diagnosis Date   Abdominal bloating 10/05/2016   Last Assessment & Plan: Formatting of this note might be different from the original. Chronic GERD with vomiting, abdominal pain, and constipation for years.  Had EGD and colonoscopy at [facility] in 2014 (reports not available).  Currently complain of 'acid reflux' vomiting, intermittent hematemesis (chronic for years), abdominal 'swelling' discomfort, and constipation (with infrequent bowel movements)   Acute bilateral low back pain without sciatica 07/21/2017   Last Assessment & Plan: Formatting of this note might be different from the original. Lumbar strain Recommend heat, stretching, otc analgesics, and weight loss. Can trial flexeril. Provided home care instructions, HEP, and return precautions.   Annual physical exam 09/30/2016   Last Assessment & Plan: Formatting of this note might be different from the original. Discussed lifestyle mods and age-appropriate preventative measures. No alcohol abuse  or tobacco/drug use. Pap smear and labs today. Depression screen negative. PHQ-2 Score: Clinic Collected PHQ-2 Total Score : 0   Chronic nonintractable headache 09/30/2016   Last Assessment & Plan: Formatting of this note might be different from the original. Discussed conservative mgmt with OTC analgesics PRN. Provided home care instructions and return/ED precautions. Limited details provided. Asked her to keep a symptom diary. Differential includes sleep apnea-will eval further on f/u visit   Gastroesophageal reflux disease 07/23/2011   Formatting of this note might be different from the original. EGD 02/2013 [facility] grade C esophagitis, large hiatal hernia EGD 01/2017 nml Last Assessment & Plan: Formatting of this note might be different from the original. Well controlled with Prilosec 40 mg BID. Discussed that this medication should be taken on empty stomach. EGD 2018 normal.   GERD (gastroesophageal reflux disease)    GERD (gastroesophageal reflux disease)    Increased frequency of urination 12/08/2017   Last Assessment & Plan: Formatting of this note might be different from the original. UA negative/normal. - Recommend increasing water intake. UA w/ culture reflex ordered today.   Need for shingles vaccine 12/08/2017   Last Assessment & Plan: Formatting of this note might be different from the original. Will administer on f/u   Non-intractable vomiting with nausea 10/05/2016   Last Assessment & Plan: Formatting of this note might be different from the original. Patient claims that it was resolved a few days after last visit.   Refused influenza vaccine 07/21/2017   Last Assessment & Plan: Formatting of this note might  be different from the original. Offered 07/21/2017 Counseled    History reviewed. No pertinent surgical history.  Social History   Socioeconomic History   Marital status: Single    Spouse name: Not on file   Number of children: Not on file   Years of education: Not on file    Highest education level: Not on file  Occupational History   Not on file  Tobacco Use   Smoking status: Never   Smokeless tobacco: Never  Substance and Sexual Activity   Alcohol use: No   Drug use: No   Sexual activity: Not on file  Other Topics Concern   Not on file  Social History Narrative   Not on file   Social Drivers of Health   Financial Resource Strain: Not on file  Food Insecurity: Not on file  Transportation Needs: Not on file  Physical Activity: Not on file  Stress: Not on file  Social Connections: Not on file  Intimate Partner Violence: Not on file    History reviewed. No pertinent family history.  No Known Allergies  Review of Systems  Constitutional:  Positive for malaise/fatigue.  Gastrointestinal:  Positive for abdominal pain and nausea.  All other systems reviewed and are negative.      Objective:   BP 120/86   Pulse 90   Ht 5' 7.5" (1.715 m)   Wt 264 lb 6.4 oz (119.9 kg)   SpO2 97%   BMI 40.80 kg/m   Vitals:   09/13/23 1118  BP: 120/86  Pulse: 90  Height: 5' 7.5" (1.715 m)  Weight: 264 lb 6.4 oz (119.9 kg)  SpO2: 97%  BMI (Calculated): 40.78    Physical Exam Vitals and nursing note reviewed.  Constitutional:      Appearance: Normal appearance. She is obese.  HENT:     Head: Normocephalic and atraumatic.     Nose: Nose normal.  Eyes:     Extraocular Movements: Extraocular movements intact.     Conjunctiva/sclera: Conjunctivae normal.     Pupils: Pupils are equal, round, and reactive to light.  Cardiovascular:     Rate and Rhythm: Normal rate.  Pulmonary:     Effort: Pulmonary effort is normal.  Musculoskeletal:        General: Normal range of motion.  Neurological:     General: No focal deficit present.     Mental Status: She is alert and oriented to person, place, and time. Mental status is at baseline.  Psychiatric:        Mood and Affect: Mood normal.        Behavior: Behavior normal.        Thought Content: Thought  content normal.        Judgment: Judgment normal.      No results found for any visits on 09/13/23.  Recent Results (from the past 2160 hours)  Iron, TIBC and Ferritin Panel     Status: None   Collection Time: 06/28/23  3:45 PM  Result Value Ref Range   Total Iron Binding Capacity 358 250 - 450 ug/dL   UIBC 161 096 - 045 ug/dL   Iron 83 27 - 409 ug/dL   Iron Saturation 23 15 - 55 %   Ferritin 31 15 - 150 ng/mL  Lipid panel     Status: None   Collection Time: 06/28/23  3:45 PM  Result Value Ref Range   Cholesterol, Total 159 100 - 199 mg/dL   Triglycerides 95 0 - 149  mg/dL   HDL 75 >16 mg/dL   VLDL Cholesterol Cal 17 5 - 40 mg/dL   LDL Chol Calc (NIH) 67 0 - 99 mg/dL   Chol/HDL Ratio 2.1 0.0 - 4.4 ratio    Comment:                                   T. Chol/HDL Ratio                                             Men  Women                               1/2 Avg.Risk  3.4    3.3                                   Avg.Risk  5.0    4.4                                2X Avg.Risk  9.6    7.1                                3X Avg.Risk 23.4   11.0   VITAMIN D 25 Hydroxy (Vit-D Deficiency, Fractures)     Status: None   Collection Time: 06/28/23  3:45 PM  Result Value Ref Range   Vit D, 25-Hydroxy 36.5 30.0 - 100.0 ng/mL    Comment: Vitamin D deficiency has been defined by the Institute of Medicine and an Endocrine Society practice guideline as a level of serum 25-OH vitamin D less than 20 ng/mL (1,2). The Endocrine Society went on to further define vitamin D insufficiency as a level between 21 and 29 ng/mL (2). 1. IOM (Institute of Medicine). 2010. Dietary reference    intakes for calcium and D. [place]: The    [employer]. 2. Holick MF, Binkley [place], Bischoff-Ferrari HA, et al.    Evaluation, treatment, and prevention of vitamin D    deficiency: an Endocrine Society clinical practice    guideline. JCEM. 2011 Jul; 96(7):1911-30.   CMP14+EGFR     Status: None    Collection Time: 06/28/23  3:45 PM  Result Value Ref Range   Glucose 92 70 - 99 mg/dL   BUN 13 8 - 27 mg/dL   Creatinine, Ser 1.09 0.57 - 1.00 mg/dL   eGFR 95 >60 AV/WUJ/8.11   BUN/Creatinine Ratio 18 12 - 28   Sodium 141 134 - 144 mmol/L   Potassium 3.7 3.5 - 5.2 mmol/L   Chloride 103 96 - 106 mmol/L   CO2 23 20 - 29 mmol/L   Calcium 9.2 8.7 - 10.3 mg/dL   Total Protein 7.3 6.0 - 8.5 g/dL   Albumin 4.2 3.9 - 4.9 g/dL   Globulin, Total 3.1 1.5 - 4.5 g/dL   Bilirubin Total 0.4 0.0 - 1.2 mg/dL   Alkaline Phosphatase 113 44 - 121 IU/L   AST 19 0 - 40 IU/L   ALT 18 0 - 32 IU/L  TSH  Status: None   Collection Time: 06/28/23  3:45 PM  Result Value Ref Range   TSH 4.010 0.450 - 4.500 uIU/mL  Hemoglobin A1c     Status: Abnormal   Collection Time: 06/28/23  3:45 PM  Result Value Ref Range   Hgb A1c MFr Bld 5.9 (H) 4.8 - 5.6 %    Comment:          Prediabetes: 5.7 - 6.4          Diabetes: >6.4          Glycemic control for adults with diabetes: <7.0    Est. average glucose Bld gHb Est-mCnc 123 mg/dL  Vitamin Z61     Status: None   Collection Time: 06/28/23  3:45 PM  Result Value Ref Range   Vitamin B-12 876 232 - 1,245 pg/mL  CBC with Diff     Status: Abnormal   Collection Time: 06/28/23  3:45 PM  Result Value Ref Range   WBC 5.0 3.4 - 10.8 x10E3/uL   RBC 4.17 3.77 - 5.28 x10E6/uL   Hemoglobin 11.8 11.1 - 15.9 g/dL   Hematocrit 09.6 04.5 - 46.6 %   MCV 92 79 - 97 fL   MCH 28.3 26.6 - 33.0 pg   MCHC 30.9 (L) 31.5 - 35.7 g/dL   RDW 40.9 81.1 - 91.4 %   Platelets 203 150 - 450 x10E3/uL   Neutrophils 57 Not Estab. %   Lymphs 33 Not Estab. %   Monocytes 8 Not Estab. %   Eos 2 Not Estab. %   Basos 0 Not Estab. %   Neutrophils Absolute 2.8 1.4 - 7.0 x10E3/uL   Lymphocytes Absolute 1.6 0.7 - 3.1 x10E3/uL   Monocytes Absolute 0.4 0.1 - 0.9 x10E3/uL   EOS (ABSOLUTE) 0.1 0.0 - 0.4 x10E3/uL   Basophils Absolute 0.0 0.0 - 0.2 x10E3/uL   Immature Granulocytes 0 Not Estab. %    Immature Grans (Abs) 0.0 0.0 - 0.1 x10E3/uL       Assessment & Plan:   Problem List Items Addressed This Visit       Other   Vitamin D deficiency, unspecified   Vitamin D was <20 Patient counseled that this could be affecting her energy level and bone health.   S[facility] supplementation [facility] to [facility], since we can get it cheaper there. Will recheck at follow up.        Excessive daytime sleepiness   Setting pt up for sleep study. Will await results so we can determine next steps.       Relevant Orders   Ambulatory referral to Sleep Studies   Other Visit Diagnoses       RUQ abdominal pain    -  Primary   Sending referral to GI for upper abdominal pain, given lack of any obvious cause. I suspect she might need an endoscopy.   Relevant Orders   Ambulatory referral to Gastroenterology       Return in about 3 months (around 12/13/2023).   Total time spent: 20 minutes  Miki Kins, FNP  09/13/2023   This document may have been prepared by [facility] Voice Recognition software and as such may include unintentional dictation errors.

## 2023-09-19 ENCOUNTER — Encounter: Payer: Self-pay | Admitting: Family

## 2023-09-19 DIAGNOSIS — E559 Vitamin D deficiency, unspecified: Secondary | ICD-10-CM | POA: Insufficient documentation

## 2023-09-19 DIAGNOSIS — G4719 Other hypersomnia: Secondary | ICD-10-CM | POA: Insufficient documentation

## 2023-09-19 NOTE — Assessment & Plan Note (Signed)
 Setting pt up for sleep study. Will await results so we can determine next steps.

## 2023-09-19 NOTE — Assessment & Plan Note (Signed)
 Vitamin D was <20 Patient counseled that this could be affecting her energy level and bone health.   Sending supplementation for [facility] to [facility], since we can get it cheaper there. Will recheck at follow up.

## 2023-10-08 ENCOUNTER — Other Ambulatory Visit: Payer: Self-pay | Admitting: Family

## 2023-10-21 ENCOUNTER — Ambulatory Visit: Admitting: Family

## 2023-10-28 ENCOUNTER — Ambulatory Visit (INDEPENDENT_AMBULATORY_CARE_PROVIDER_SITE_OTHER): Admitting: Family

## 2023-10-28 ENCOUNTER — Encounter: Payer: Self-pay | Admitting: Family

## 2023-10-28 VITALS — BP 130/90 | HR 78 | Ht 67.5 in | Wt 259.4 lb

## 2023-10-28 DIAGNOSIS — E559 Vitamin D deficiency, unspecified: Secondary | ICD-10-CM

## 2023-10-28 DIAGNOSIS — K219 Gastro-esophageal reflux disease without esophagitis: Secondary | ICD-10-CM

## 2023-10-28 DIAGNOSIS — I10 Essential (primary) hypertension: Secondary | ICD-10-CM

## 2023-10-28 MED ORDER — WEGOVY 0.5 MG/0.5ML ~~LOC~~ SOAJ: 0.5000 mg | SUBCUTANEOUS | 0 refills | Status: DC

## 2023-10-28 MED ORDER — AMLODIPINE BESYLATE 10 MG PO TABS: 10.0000 mg | ORAL_TABLET | Freq: Every day | ORAL | 1 refills | Status: AC

## 2023-10-28 MED ORDER — WEGOVY 1 MG/0.5ML ~~LOC~~ SOAJ: 1.0000 mg | SUBCUTANEOUS | 0 refills | Status: DC

## 2023-10-28 NOTE — Assessment & Plan Note (Signed)
 Vitamin D was <20 Patient counseled that this could be affecting her energy level and bone health.   S[facility] to [facility] since we can get it cheaper there. Will recheck at follow up.

## 2023-10-28 NOTE — Assessment & Plan Note (Signed)
 Patient already referred to [facility] for follow up.  Also gave pt samples ov Voquezna .  Will let me know whether this is working or not.  Reassess at follow up.

## 2023-10-28 NOTE — Progress Notes (Signed)
 Established Patient Office Visit  Subjective:  Patient ID: Renee Gray, female    DOB: 02-03-1961  Age: 63 y.o. MRN: [id number]  Chief Complaint  Patient presents with   Follow-up    Patient is here today for her 1 month follow up.  She has been feeling fairly well since last appointment.   She does have additional concerns to discuss today.  She has been doing well with the [facility], no side effects. Needs increase in dose.  She asks about reflux medication, does not remember if the medication I sent her previously worked. Did get referral sent to GI at her last appointment, but she has not heard from them yet.  Needs increase in her BP meds.   Labs are not due today. She needs refills.   I have reviewed her active problem list, medication list, allergies, health maintenance, notes from last encounter, lab results for her appointment today.      No other concerns at this time.   Past Medical History:  Diagnosis Date   Abdominal bloating 10/05/2016   Last Assessment & Plan: Formatting of this note might be different from the original. Chronic GERD with vomiting, abdominal pain, and constipation for years.  Had EGD and colonoscopy at [facility] in 2014 (reports not available).  Currently complain of 'acid reflux' vomiting, intermittent hematemesis (chronic for years), abdominal 'swelling' discomfort, and constipation (with infrequent bowel movements)   Acute bilateral low back pain without sciatica 07/21/2017   Last Assessment & Plan: Formatting of this note might be different from the original. Lumbar strain Recommend heat, stretching, otc analgesics, and weight loss. Can trial flexeril . Provided home care instructions, HEP, and return precautions.   Annual physical exam 09/30/2016   Last Assessment & Plan: Formatting of this note might be different from the original. Discussed lifestyle mods and age-appropriate preventative measures. No alcohol abuse or tobacco/drug use. Pap smear  and labs today. Depression screen negative. PHQ-2 Score: Clinic Collected PHQ-2 Total Score : 0   Chronic nonintractable headache 09/30/2016   Last Assessment & Plan: Formatting of this note might be different from the original. Discussed conservative mgmt with OTC analgesics PRN. Provided home care instructions and return/ED precautions. Limited details provided. Asked her to keep a symptom diary. Differential includes sleep apnea-will eval further on f/u visit   Gastroesophageal reflux disease 07/23/2011   Formatting of this note might be different from the original. EGD 02/2013 [facility] grade C esophagitis, large hiatal hernia EGD 01/2017 nml Last Assessment & Plan: Formatting of this note might be different from the original. Well controlled with Prilosec 40 mg BID. Discussed that this medication should be taken on empty stomach. EGD 2018 normal.   GERD (gastroesophageal reflux disease)    GERD (gastroesophageal reflux disease)    Increased frequency of urination 12/08/2017   Last Assessment & Plan: Formatting of this note might be different from the original. UA negative/normal. - Recommend increasing water intake. UA w/ culture reflex ordered today.   Need for shingles vaccine 12/08/2017   Last Assessment & Plan: Formatting of this note might be different from the original. Will administer on f/u   Non-intractable vomiting with nausea 10/05/2016   Last Assessment & Plan: Formatting of this note might be different from the original. Patient claims that it was resolved a few days after last visit.   Refused influenza vaccine 07/21/2017   Last Assessment & Plan: Formatting of this note might be different from the original. Offered 07/21/2017 Counseled  History reviewed. No pertinent surgical history.  Social History   Socioeconomic History   Marital status: Single    Spouse name: Not on file   Number of children: Not on file   Years of education: Not on file   Highest education level: Not  on file  Occupational History   Not on file  Tobacco Use   Smoking status: Never   Smokeless tobacco: Never  Substance and Sexual Activity   Alcohol use: No   Drug use: No   Sexual activity: Not on file  Other Topics Concern   Not on file  Social History Narrative   Not on file   Social Drivers of Health   Financial Resource Strain: Not on file  Food Insecurity: Not on file  Transportation Needs: Not on file  Physical Activity: Not on file  Stress: Not on file  Social Connections: Not on file  Intimate Partner Violence: Not on file    History reviewed. No pertinent family history.  No Known Allergies  Review of Systems  Gastrointestinal:  Positive for heartburn and nausea.  All other systems reviewed and are negative.      Objective:   BP (!) 130/90   Pulse 78   Ht 5' 7.5" (1.715 m)   Wt 259 lb 6.4 oz (117.7 kg)   SpO2 93%   BMI 40.03 kg/m   Vitals:   10/28/23 1026  BP: (!) 130/90  Pulse: 78  Height: 5' 7.5" (1.715 m)  Weight: 259 lb 6.4 oz (117.7 kg)  SpO2: 93%  BMI (Calculated): 40    Physical Exam Vitals and nursing note reviewed.  Constitutional:      Appearance: Normal appearance. She is normal weight.  HENT:     Head: Normocephalic.  Eyes:     Extraocular Movements: Extraocular movements intact.     Conjunctiva/sclera: Conjunctivae normal.     Pupils: Pupils are equal, round, and reactive to light.  Cardiovascular:     Rate and Rhythm: Normal rate.  Pulmonary:     Effort: Pulmonary effort is normal.  Musculoskeletal:     Cervical back: Normal range of motion.  Neurological:     General: No focal deficit present.     Mental Status: She is alert and oriented to person, place, and time. Mental status is at baseline.  Psychiatric:        Mood and Affect: Mood normal.        Behavior: Behavior normal.        Thought Content: Thought content normal.        Judgment: Judgment normal.      No results found for any visits on  10/28/23.  No results found for this or any previous visit (from the past 2160 hours).     Assessment & Plan:   Problem List Items Addressed This Visit       Digestive   GERD (gastroesophageal reflux disease) - Primary   Patient already referred to [facility] for follow up.  Also gave pt samples ov Voquezna .  Will let me know whether this is working or not.  Reassess at follow up.        Other   Morbid obesity (HCC)   Continue current meds.  Will adjust as needed based on results.  The patient is asked to make an attempt to improve diet and exercise patterns to aid in medical management of this problem. Addressed importance of increasing and maintaining water intake.  Relevant Medications   Semaglutide-Weight Management (WEGOVY) 0.5 MG/0.5ML SOAJ   Semaglutide-Weight Management (WEGOVY) 1 MG/0.5ML SOAJ   Vitamin D  deficiency, unspecified   Vitamin D  was <20 Patient counseled that this could be affecting her energy level and bone health.   Sending supplementation for [facility] to [facility] since we can get it cheaper there. Will recheck at follow up.         Return in about 2 months (around 12/28/2023) for F/U.   Total time spent: 20 minutes  Trenda Frisk, FNP  10/28/2023   This document may have been prepared by [facility] Voice Recognition software and as such may include unintentional dictation errors.

## 2023-10-28 NOTE — Assessment & Plan Note (Signed)
 Continue current meds.  Will adjust as needed based on results.  The patient is asked to make an attempt to improve diet and exercise patterns to aid in medical management of this problem. Addressed importance of increasing and maintaining water intake.
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
## 2023-12-06 ENCOUNTER — Ambulatory Visit: Payer: Self-pay | Admitting: Internal Medicine

## 2023-12-28 ENCOUNTER — Ambulatory Visit: Admitting: Family
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
## 2024-01-31 ENCOUNTER — Ambulatory Visit (INDEPENDENT_AMBULATORY_CARE_PROVIDER_SITE_OTHER): Payer: Self-pay | Admitting: Internal Medicine

## 2024-01-31 VITALS — BP 137/98 | HR 85 | Resp 16 | Ht 67.0 in | Wt 260.0 lb

## 2024-01-31 DIAGNOSIS — I10 Essential (primary) hypertension: Secondary | ICD-10-CM

## 2024-01-31 DIAGNOSIS — R0689 Other abnormalities of breathing: Secondary | ICD-10-CM | POA: Diagnosis not present

## 2024-02-04 NOTE — Progress Notes (Signed)
 No show

## 2024-05-08 ENCOUNTER — Encounter: Payer: Self-pay | Admitting: Family

## 2024-05-08 ENCOUNTER — Ambulatory Visit (INDEPENDENT_AMBULATORY_CARE_PROVIDER_SITE_OTHER): Admitting: Family

## 2024-05-08 VITALS — BP 140/90 | HR 83 | Ht 67.5 in | Wt 256.6 lb

## 2024-05-08 DIAGNOSIS — K449 Diaphragmatic hernia without obstruction or gangrene: Secondary | ICD-10-CM | POA: Diagnosis not present

## 2024-05-08 DIAGNOSIS — I10 Essential (primary) hypertension: Secondary | ICD-10-CM

## 2024-05-08 DIAGNOSIS — J069 Acute upper respiratory infection, unspecified: Secondary | ICD-10-CM | POA: Diagnosis not present

## 2024-05-08 DIAGNOSIS — Z131 Encounter for screening for diabetes mellitus: Secondary | ICD-10-CM

## 2024-05-08 MED ORDER — HYDROCOD POLI-CHLORPHE POLI ER 10-8 MG/5ML PO SUER: 5.0000 mL | Freq: Two times a day (BID) | ORAL | 0 refills | Status: DC | PRN

## 2024-05-08 MED ORDER — DOXYCYCLINE HYCLATE 100 MG PO CAPS: 100.0000 mg | ORAL_CAPSULE | Freq: Two times a day (BID) | ORAL | 0 refills | Status: DC

## 2024-05-08 NOTE — Progress Notes (Unsigned)
 Established Patient Office Visit  Subjective:  Patient ID: Renee Gray, female    DOB: 1960-11-09  Age: 63 y.o. MRN: [id number]  Chief Complaint  Patient presents with   Follow-up    Surgery clearance    Patient is here today for cough and for her hernia.  She went to [facility] for evaluation and was found to have HMPV.  She still has cough, congestion, and feels bad in general.  She says that the cough meds did not help with her cough, and she went to [facility]  (last Tuesday or Wednesday) and was given abx and something else, but she's still not feeling back to normal.   She was found to have a large paraesophageal hernia at [facility] when they did a CXR, likely the origin of her GERD that we have been trying to control.  Needs referral to have this corrected.   No other concerns at this time.      Past Medical History:  Diagnosis Date   Abdominal bloating 10/05/2016   Last Assessment & Plan: Formatting of this note might be different from the original. Chronic GERD with vomiting, abdominal pain, and constipation for years.  Had EGD and colonoscopy at [facility] in 2014 (reports not available).  Currently complain of 'acid reflux' vomiting, intermittent hematemesis (chronic for years), abdominal 'swelling' discomfort, and constipation (with infrequent bowel movements)   Acute bilateral low back pain without sciatica 07/21/2017   Last Assessment & Plan: Formatting of this note might be different from the original. Lumbar strain Recommend heat, stretching, otc analgesics, and weight loss. Can trial flexeril . Provided home care instructions, HEP, and return precautions.   Annual physical exam 09/30/2016   Last Assessment & Plan: Formatting of this note might be different from the original. Discussed lifestyle mods and age-appropriate preventative measures. No alcohol abuse or tobacco/drug use. Pap smear and labs today. Depression screen negative. PHQ-2 Score: Clinic Collected PHQ-2 Total Score : 0    Chronic nonintractable headache 09/30/2016   Last Assessment & Plan: Formatting of this note might be different from the original. Discussed conservative mgmt with OTC analgesics PRN. Provided home care instructions and return/ED precautions. Limited details provided. Asked her to keep a symptom diary. Differential includes sleep apnea-will eval further on f/u visit   Gastroesophageal reflux disease 07/23/2011   Formatting of this note might be different from the original. EGD 02/2013 [facility] grade C esophagitis, large hiatal hernia EGD 01/2017 nml Last Assessment & Plan: Formatting of this note might be different from the original. Well controlled with Prilosec 40 mg BID. Discussed that this medication should be taken on empty stomach. EGD 2018 normal.   GERD (gastroesophageal reflux disease)    GERD (gastroesophageal reflux disease)    Increased frequency of urination 12/08/2017   Last Assessment & Plan: Formatting of this note might be different from the original. UA negative/normal. - Recommend increasing water intake. UA w/ culture reflex ordered today.   Need for shingles vaccine 12/08/2017   Last Assessment & Plan: Formatting of this note might be different from the original. Will administer on f/u   Non-intractable vomiting with nausea 10/05/2016   Last Assessment & Plan: Formatting of this note might be different from the original. Patient claims that it was resolved a few days after last visit.   Refused influenza vaccine 07/21/2017   Last Assessment & Plan: Formatting of this note might be different from the original. Offered 07/21/2017 Counseled    No past surgical history on  file.  Social History   Socioeconomic History   Marital status: Single    Spouse name: Not on file   Number of children: Not on file   Years of education: Not on file   Highest education level: Not on file  Occupational History   Not on file  Tobacco Use   Smoking status: Former    Types: Cigarettes    Smokeless tobacco: Never  Substance and Sexual Activity   Alcohol use: No   Drug use: No   Sexual activity: Not on file  Other Topics Concern   Not on file  Social History Narrative   Not on file   Social Drivers of Health   Financial Resource Strain: Not on file  Food Insecurity: Not on file  Transportation Needs: Not on file  Physical Activity: Not on file  Stress: Not on file  Social Connections: Not on file  Intimate Partner Violence: Not on file    No family history on file.  No Known Allergies  Review of Systems  Gastrointestinal:  Positive for abdominal pain, heartburn, nausea and vomiting.  All other systems reviewed and are negative.      Objective:   BP (!) 140/90   Pulse 83   Ht 5' 7.5 (1.715 m)   Wt 256 lb 9.6 oz (116.4 kg)   SpO2 95%   BMI 39.60 kg/m   Vitals:   05/08/24 1437  BP: (!) 140/90  Pulse: 83  Height: 5' 7.5 (1.715 m)  Weight: 256 lb 9.6 oz (116.4 kg)  SpO2: 95%  BMI (Calculated): 39.57    Physical Exam Vitals and nursing note reviewed.  Constitutional:      Appearance: Normal appearance. She is normal weight.  HENT:     Head: Normocephalic.  Eyes:     Extraocular Movements: Extraocular movements intact.     Conjunctiva/sclera: Conjunctivae normal.     Pupils: Pupils are equal, round, and reactive to light.  Cardiovascular:     Rate and Rhythm: Normal rate.  Pulmonary:     Effort: Pulmonary effort is normal.  Abdominal:     Tenderness: There is abdominal tenderness.  Neurological:     General: No focal deficit present.     Mental Status: She is alert and oriented to person, place, and time. Mental status is at baseline.  Psychiatric:        Mood and Affect: Mood normal.        Behavior: Behavior normal.        Thought Content: Thought content normal.      No results found for any visits on 05/08/24.  No results found for this or any previous visit (from the past 2160 hours).     Assessment &  Plan Paraesophageal hernia Setting patient up for referral to general surgery.  Will defer to them for further treatment changes.  Reassess at follow up.  Morbid obesity (HCC) Continue current meds.  Will adjust as needed based on results.  The patient is asked to make an attempt to improve diet and exercise patterns to aid in medical management of this problem. Addressed importance of increasing and maintaining water intake.   Acute upper respiratory infection Patient has already taken 1 round of antibiotics and has not improved at all.  Sending a different antibiotic and will reevaluate.  Also sending tussionex for patient.     Return as previously scheduled.   Total time spent: 20 minutes  ALAN CHRISTELLA ARRANT, FNP  05/08/2024  This document may have been prepared by [employer] Voice Recognition software and as such may include unintentional dictation errors.

## 2024-05-10 NOTE — Assessment & Plan Note (Signed)
 Continue current meds.  Will adjust as needed based on results.  The patient is asked to make an attempt to improve diet and exercise patterns to aid in medical management of this problem. Addressed importance of increasing and maintaining water  intake.

## 2024-05-15 ENCOUNTER — Ambulatory Visit: Admitting: Family

## 2024-05-23 ENCOUNTER — Ambulatory Visit: Payer: Self-pay | Admitting: General Surgery

## 2024-05-24 ENCOUNTER — Encounter: Payer: Self-pay | Admitting: Surgery

## 2024-05-24 ENCOUNTER — Ambulatory Visit (INDEPENDENT_AMBULATORY_CARE_PROVIDER_SITE_OTHER): Payer: Self-pay | Admitting: Surgery

## 2024-05-24 VITALS — BP 112/72 | HR 91 | Ht 67.5 in | Wt 252.0 lb

## 2024-05-24 DIAGNOSIS — K439 Ventral hernia without obstruction or gangrene: Secondary | ICD-10-CM

## 2024-05-24 DIAGNOSIS — K449 Diaphragmatic hernia without obstruction or gangrene: Secondary | ICD-10-CM | POA: Diagnosis not present

## 2024-05-24 NOTE — Progress Notes (Signed)
 Patient ID: Renee Gray, female   DOB: September 25, 1960, 63 y.o.   MRN: [id number]  HPI Renee Gray is a 63 y.o. female seen in consultation at the request of Mrs. Surely for paraesophageal hernia.  She does report significant reflux on a daily basis that is only partially relief with PPI.  She also experiences significant cough and regurgitation.  She endorses no dysphagia. She does endorse some intermittent atypical chest pains he also reports vomiting after eating large meals    She did have a prior CT 2018 that I personally reviewed showing a giant paraesophageal hernia.  She also had a prior ultrasound that have also reviewed showing fatty infiltration of the liver. She has had a prior cholecystectomy years ago. She is able to perform more than 4 METS of activity without shortness of breath or chest pain.  CBC and CMP normal HPI  Past Medical History:  Diagnosis Date   Abdominal bloating 10/05/2016   Last Assessment & Plan: Formatting of this note might be different from the original. Chronic GERD with vomiting, abdominal pain, and constipation for years.  Had EGD and colonoscopy at [facility] in 2014 (reports not available).  Currently complain of 'acid reflux' vomiting, intermittent hematemesis (chronic for years), abdominal 'swelling' discomfort, and constipation (with infrequent bowel movements)   Acute bilateral low back pain without sciatica 07/21/2017   Last Assessment & Plan: Formatting of this note might be different from the original. Lumbar strain Recommend heat, stretching, otc analgesics, and weight loss. Can trial flexeril . Provided home care instructions, HEP, and return precautions.   Annual physical exam 09/30/2016   Last Assessment & Plan: Formatting of this note might be different from the original. Discussed lifestyle mods and age-appropriate preventative measures. No alcohol abuse or tobacco/drug use. Pap smear and labs today. Depression screen negative. PHQ-2 Score: Clinic  Collected PHQ-2 Total Score : 0   Chronic nonintractable headache 09/30/2016   Last Assessment & Plan: Formatting of this note might be different from the original. Discussed conservative mgmt with OTC analgesics PRN. Provided home care instructions and return/ED precautions. Limited details provided. Asked her to keep a symptom diary. Differential includes sleep apnea-will eval further on f/u visit   Gastroesophageal reflux disease 07/23/2011   Formatting of this note might be different from the original. EGD 02/2013 [facility] grade C esophagitis, large hiatal hernia EGD 01/2017 nml Last Assessment & Plan: Formatting of this note might be different from the original. Well controlled with Prilosec 40 mg BID. Discussed that this medication should be taken on empty stomach. EGD 2018 normal.   GERD (gastroesophageal reflux disease)    GERD (gastroesophageal reflux disease)    Increased frequency of urination 12/08/2017   Last Assessment & Plan: Formatting of this note might be different from the original. UA negative/normal. - Recommend increasing water intake. UA w/ culture reflex ordered today.   Need for shingles vaccine 12/08/2017   Last Assessment & Plan: Formatting of this note might be different from the original. Will administer on f/u   Non-intractable vomiting with nausea 10/05/2016   Last Assessment & Plan: Formatting of this note might be different from the original. Patient claims that it was resolved a few days after last visit.   Refused influenza vaccine 07/21/2017   Last Assessment & Plan: Formatting of this note might be different from the original. Offered 07/21/2017 Counseled    Prior laparoscopic cholecystectomy  No significant family history  Social History Social History   Tobacco Use   Smoking  status: Former    Types: Cigarettes    Passive exposure: Past   Smokeless tobacco: Never  Vaping Use   Vaping status: Never Used  Substance Use Topics   Alcohol use: No   Drug  use: No    No Known Allergies  Current Outpatient Medications  Medication Sig Dispense Refill   amLODipine  (NORVASC ) 10 MG tablet Take 1 tablet (10 mg total) by mouth daily. 90 tablet 1   Ferrous Sulfate (IRON SLOW RELEASE PO) Take by mouth.     omeprazole  (PRILOSEC) 40 MG capsule TAKE 1 CAPSULE BY MOUTH TWICE DAILY 180 capsule 1   rosuvastatin  (CRESTOR ) 10 MG tablet TAKE 1 TABLET BY MOUTH EVERY EVENING 90 tablet 3   Vitamin D , Ergocalciferol , (DRISDOL ) 1.25 MG (50000 UNIT) CAPS capsule Take 1 capsule (50,000 Units total) by mouth every 7 (seven) days. 12 capsule 1   No current facility-administered medications for this visit.     Review of Systems Full ROS  was asked and was negative except for the information on the HPI  Physical Exam Blood pressure 112/72, pulse 91, height 5' 7.5 (1.715 m), weight 252 lb (114.3 kg), SpO2 95%. CONSTITUTIONAL: NAD. EYES: Pupils are equal, round, Sclera are non-icteric. EARS, NOSE, MOUTH AND THROAT: The oropharynx is clear. The oral mucosa is pink and moist. Hearing is intact to voice. LYMPH NODES:  Lymph nodes in the neck are normal. RESPIRATORY:  Lungs are clear. There is normal respiratory effort, with equal breath sounds bilaterally, and without pathologic use of accessory muscles. CARDIOVASCULAR: Heart is regular without murmurs, gallops, or rubs. GI: The abdomen is  soft, nontender, and nondistended. There are no palpable masses. There is no hepatosplenomegaly. There are normal bowel sounds .  Reducible ventral hernia about 2 cm.  GU: Rectal deferred.   MUSCULOSKELETAL: Normal muscle strength and tone. No cyanosis or edema.   SKIN: Turgor is good and there are no pathologic skin lesions or ulcers. NEUROLOGIC: Motor and sensation is grossly normal. Cranial nerves are grossly intact. PSYCH:  Oriented to person, place and time. Affect is normal.  Data Reviewed I have personally reviewed the patient's imaging, laboratory findings and medical  records.    Assessment/Plan Giant type III paraesophageal hernia Ventral hernia , may address it during same operative setting potentially  I do think we will need to fix it at some point in time We can start w/u Swallow and CT BMI 39 d/w the pt the importance of weight optimization she is committed to exercise and diet EGD in the future Hiatal hernia causing significant symptoms   I had a good conversation with the patient regarding her disease process.  I do think repair of the hernia would be beneficial I had also a good discussion regarding robotic paraesophageal hernia .  We specifically talked about the risk the benefits,  the possible complications as well as a diet medication that the surgery will entail. Risks include but not limited to: Bleeding, esophageal or bowel  injuries, bloating, dysphagia. I personally spent a total of 60 minutes in the care of the patient today including performing a medically appropriate exam/evaluation, counseling and educating, placing orders, referring and communicating with other health care professionals, documenting clinical information in the EHR, independently interpreting and reviewing images studies and coordinating care.    Laneta Luna, MD FACS General Surgeon 05/24/2024, 10:30 AM

## 2024-05-24 NOTE — Patient Instructions (Addendum)
 We will need for you to have several tests done as well as have an Upper Endoscopy scoping completed to better assess your hiatal hernia.  We will send a referral to [facility] for them to set you up for an Upper Endoscopy and Colonoscopy. They will call you to schedule this.    We have scheduled you for a CT Scan of your Abdomen and Pelvis with contrast. This has been scheduled at [facility] on 06/06/24. Please arrive there by 9:30 am and enter in through the [facility]. If you need to reschedule your Scan, you may do so by calling [phone]. Please let us  know if you reschedule your scan as we have to get authorization from your insurance for this.   Follow-up with our office in 2 months after we get your results.   Please call and ask to speak with a nurse if you develop questions or concerns.  Hiatal Hernia  A hiatal hernia occurs when part of the stomach slides above the muscle that separates the abdomen from the chest (diaphragm). A person can be born with a hiatal hernia (congenital), or it may develop over time. In almost all cases of hiatal hernia, only the top part of the stomach pushes through the diaphragm. Many people have a hiatal hernia with no symptoms. The larger the hernia, the more likely it is that you will have symptoms. In some cases, a hiatal hernia allows stomach acid to flow back into the tube that carries food from your mouth to your stomach (esophagus). This may cause heartburn symptoms. The development of heartburn symptoms may mean that you have a condition called gastroesophageal reflux disease (GERD). What are the causes? This condition is caused by a weakness in the opening (hiatus) where the esophagus passes through the diaphragm to attach to the upper part of the stomach. A person may be born with a weakness in the hiatus, or a weakness can develop over time. What increases the risk? This condition is more likely to develop in: Older people. Age is  a major risk factor for a hiatal hernia, especially if you are over the age of 34. Pregnant women. People who are overweight. People who have frequent constipation. What are the signs or symptoms? Symptoms of this condition usually develop in the form of GERD symptoms. Symptoms include: Heartburn. Upset stomach (indigestion). Trouble swallowing. Coughing or wheezing. Wheezing is making high-pitched whistling sounds when you breathe. Sore throat. Chest pain. Nausea and vomiting. How is this diagnosed? This condition may be diagnosed during testing for GERD. Tests that may be done include: X-rays of your stomach or chest. An upper gastrointestinal (GI) series. This is an X-ray exam of your GI tract that is taken after you swallow a chalky liquid that shows up clearly on the X-ray. Endoscopy. This is a procedure to look into your stomach using a thin, flexible tube that has a tiny camera and light on the end of it. How is this treated? This condition may be treated by: Dietary and lifestyle changes to help reduce GERD symptoms. Medicines. These may include: Over-the-counter antacids. Medicines that make your stomach empty more quickly. Medicines that block the production of stomach acid (H2 blockers). Stronger medicines to reduce stomach acid (proton pump inhibitors). Surgery to repair the hernia, if other treatments are not helping. If you have no symptoms, you may not need treatment. Follow these instructions at home: Lifestyle and activity Do not use any products that contain nicotine or tobacco. These  products include cigarettes, chewing tobacco, and vaping devices, such as e-cigarettes. If you need help quitting, ask your health care provider. Try to achieve and maintain a healthy body weight. Avoid putting pressure on your abdomen. Anything that puts pressure on your abdomen increases the amount of acid that may be pushed up into your esophagus. Avoid bending over, especially  after eating. Raise the head of your bed by putting blocks under the legs. This keeps your head and esophagus higher than your stomach. Do not wear tight clothing around your chest or stomach. Try not to strain when having a bowel movement, when urinating, or when lifting heavy objects. Eating and drinking Avoid foods that can worsen GERD symptoms. These may include: Fatty foods, like fried foods. Citrus fruits, like oranges or lemon. Other foods and drinks that contain acid, like orange juice or tomatoes. Spicy food. Chocolate. Eat frequent small meals instead of three large meals a day. This helps prevent your stomach from getting too full. Eat slowly. Do not lie down right after eating. Do not eat 1-2 hours before bed. Do not drink beverages with caffeine. These include cola, coffee, cocoa, and tea. Do not drink alcohol. General instructions Take over-the-counter and prescription medicines only as told by your health care provider. Keep all follow-up visits. Your health care provider will want to check that any new prescribed medicines are helping your symptoms. Contact a health care provider if: Your symptoms are not controlled with medicines or lifestyle changes. You are having trouble swallowing. You have coughing or wheezing that will not go away. Your pain is getting worse. Your pain spreads to your arms, neck, jaw, teeth, or back. You feel nauseous or you vomit. Get help right away if: You have shortness of breath. You vomit blood. You have bright red blood in your stools. You have black, tarry stools. These symptoms may be an emergency. Get help right away. Call 911. Do not wait to see if the symptoms will go away. Do not drive yourself to the hospital. Summary A hiatal hernia occurs when part of the stomach slides above the muscle that separates the abdomen from the chest. A person may be born with a weakness in the hiatus, or a weakness can develop over  time. Symptoms of a hiatal hernia may include heartburn, trouble swallowing, or sore throat. Management of a hiatal hernia includes eating frequent small meals instead of three large meals a day. Get help right away if you vomit blood, have bright red blood in your stools, or have black, tarry stools. This information is not intended to replace advice given to you by your health care provider. Make sure you discuss any questions you have with your health care provider. Document Revised: 07/29/2021 Document Reviewed: 07/29/2021 Elsevier Patient Education  2024 [employer].

## 2024-06-06 ENCOUNTER — Ambulatory Visit: Payer: Self-pay

## 2024-06-06 ENCOUNTER — Other Ambulatory Visit: Payer: Self-pay

## 2024-06-06 ENCOUNTER — Ambulatory Visit
Admission: RE | Admit: 2024-06-06 | Discharge: 2024-06-06 | Disposition: A | Discharge status: Home / Self Care | Payer: Self-pay | Source: Ambulatory Visit | Attending: Surgery | Admitting: Surgery

## 2024-06-06 DIAGNOSIS — K449 Diaphragmatic hernia without obstruction or gangrene: Secondary | ICD-10-CM

## 2024-06-06 MED ORDER — IOHEXOL 300 MG/ML  SOLN
100.0000 mL | Freq: Once | INTRAMUSCULAR | Status: AC | PRN
  Administered 2024-06-06: 100 mL via INTRAVENOUS

## 2024-06-20 ENCOUNTER — Ambulatory Visit: Payer: Self-pay | Admitting: Surgery

## 2024-06-27 ENCOUNTER — Telehealth: Payer: Self-pay | Admitting: Family Medicine

## 2024-06-27 ENCOUNTER — Other Ambulatory Visit: Payer: Self-pay

## 2024-06-27 NOTE — Telephone Encounter (Signed)
 The patient called stating she was scheduled for a procedure on Thursday. Upon review, an office visit was noted rather than a procedure. The patient requested to speak with a nurse only. The matter was escalated to my manager, Ginger, who advised that the patient be transferred to her.

## 2024-06-28 NOTE — Progress Notes (Signed)
 "   06/29/2024 Renee Gray [id number] 24-Nov-1960  Gastroenterology Office Note    Referring Provider: Orlean Alan HERO, FNP Primary Care Physician:  Orlean Alan HERO, FNP  Primary GI Provider: Celestia Rima, NP; Jinny Carmine, MD    Chief Complaint   Chief Complaint  Patient presents with   New Patient (Initial Visit)    Paraesophageal hernia- needs egd -medication helps     History of Present Illness   Renee Gray is a 64 y.o. female with presenting today at the request of Orlean Alan HERO, FNP due to hiatal hernia.  Discussed the use of AI scribe software for clinical note transcription with the patient, who gave verbal consent to proceed.  Longstanding acid reflux and large hiatal hernia, previously evaluated for possible surgical repair. Reflux symptoms have been present for many years, with prior episodes severe enough to cause hematemesis, though no recent vomiting of blood. No prior upper endoscopy during episodes of hematemesis. Recent preoperative workup included CT scan and swallow study. Colonoscopy and upper endoscopy ten years ago revealed one polyp.  Current reflux symptoms are managed with omeprazole  40 mg twice daily, obtained over the counter and reported as effective. Previously used prescription omeprazole  but switched to non-prescription due to access issues; now has Medicaid and is open to resuming prescription. Identifies triggers including spaghetti with red sauce, spicy foods, chocolate, tomatoes, and citrus. Avoids large meals due to regurgitation. Drinks carbonated sodas (zero sugar) and coffee (one cup after night shifts). Reports difficulty with dietary modifications. Denies current pain, dysphagia, or significant weight loss.  Intermittent constipation, sometimes with several days between bowel movements. Does not routinely use laxatives but has Miralax at home. Low water intake. Denies alcohol use and NSAID or ibuprofen  use; uses Tylenol  for  headaches and flu symptoms.   06/06/2024 CT Abd pelvis 1. Large paraesophageal hernia, containing the majority of the stomach as detailed on today's esophagram, dictated separately. 2. No other herniated abdominal contents. 3. 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 4.  Aortic Atherosclerosis (ICD10-I70.0).  06/06/2024 swallow eval Large type III paraesophageal hernia containing nearly the entirety of the stomach. 2. Mild esophageal dysmotility. 3. Small volume spontaneous gastroesophageal reflux.  09/02/2023 US  Abdomen Complete IMPRESSION: 1. No acute abnormality identified. 2. Prior cholecystectomy. 3. Increased echotexture of the liver. This is a nonspecific finding but can be seen in fatty infiltration of liver.  02/14/2013 EGD and colonoscopy at [facility].  Unable to view results  Past Medical History:  Diagnosis Date   Abdominal bloating 10/05/2016   Last Assessment & Plan: Formatting of this note might be different from the original. Chronic GERD with vomiting, abdominal pain, and constipation for years.  Had EGD and colonoscopy at [facility] in 2014 (reports not available).  Currently complain of 'acid reflux' vomiting, intermittent hematemesis (chronic for years), abdominal 'swelling' discomfort, and constipation (with infrequent bowel movements)   Acute bilateral low back pain without sciatica 07/21/2017   Last Assessment & Plan: Formatting of this note might be different from the original. Lumbar strain Recommend heat, stretching, otc analgesics, and weight loss. Can trial flexeril . Provided home care instructions, HEP, and return precautions.   Annual physical exam 09/30/2016   Last Assessment & Plan: Formatting of this note might be different from the original. Discussed lifestyle mods and age-appropriate preventative measures. No alcohol abuse or tobacco/drug use. Pap smear and labs today. Depression screen negative. PHQ-2 Score: Clinic Collected PHQ-2  Total Score : 0   Chronic nonintractable headache  09/30/2016   Last Assessment & Plan: Formatting of this note might be different from the original. Discussed conservative mgmt with OTC analgesics PRN. Provided home care instructions and return/ED precautions. Limited details provided. Asked her to keep a symptom diary. Differential includes sleep apnea-will eval further on f/u visit   Gastroesophageal reflux disease 07/23/2011   Formatting of this note might be different from the original. EGD 02/2013 [facility] grade C esophagitis, large hiatal hernia EGD 01/2017 nml Last Assessment & Plan: Formatting of this note might be different from the original. Well controlled with Prilosec 40 mg BID. Discussed that this medication should be taken on empty stomach. EGD 2018 normal.   GERD (gastroesophageal reflux disease)    GERD (gastroesophageal reflux disease)    Increased frequency of urination 12/08/2017   Last Assessment & Plan: Formatting of this note might be different from the original. UA negative/normal. - Recommend increasing water intake. UA w/ culture reflex ordered today.   Need for shingles vaccine 12/08/2017   Last Assessment & Plan: Formatting of this note might be different from the original. Will administer on f/u   Non-intractable vomiting with nausea 10/05/2016   Last Assessment & Plan: Formatting of this note might be different from the original. Patient claims that it was resolved a few days after last visit.   Refused influenza vaccine 07/21/2017   Last Assessment & Plan: Formatting of this note might be different from the original. Offered 07/21/2017 Counseled    History reviewed. No pertinent surgical history.  Current Outpatient Medications  Medication Sig Dispense Refill   amLODipine  (NORVASC ) 10 MG tablet Take 1 tablet (10 mg total) by mouth daily. 90 tablet 1   Ferrous Sulfate (IRON SLOW RELEASE PO) Take by mouth.     Na Sulfate-K Sulfate-Mg Sulfate concentrate (SUPREP)  17.5-3.13-1.6 GM/177ML SOLN Take 1 kit (354 mLs total) by mouth once for 1 dose. At 5 PM the day before your procedure pour the contents of one bottle of Suprep into the mixing container provided.  Fill the container, with ice cold water, up to the 16 oz fill line, and drink the entire amount. Then 5 hours before procedure pour the contents of the second bottle of Suprep into the mixing container provided and follow the same instructions. 354 mL 0   rosuvastatin  (CRESTOR ) 10 MG tablet TAKE 1 TABLET BY MOUTH EVERY EVENING 90 tablet 3   Vitamin D , Ergocalciferol , (DRISDOL ) 1.25 MG (50000 UNIT) CAPS capsule Take 1 capsule (50,000 Units total) by mouth every 7 (seven) days. 12 capsule 1   omeprazole  (PRILOSEC) 40 MG capsule Take 1 capsule (40 mg total) by mouth 2 (two) times daily. 180 capsule 1   No current facility-administered medications for this visit.    Allergies as of 06/29/2024   (No Known Allergies)    Family History  Problem Relation Age of Onset   Healthy Mother    Healthy Father     Social History   Socioeconomic History   Marital status: Single    Spouse name: Not on file   Number of children: Not on file   Years of education: Not on file   Highest education level: Not on file  Occupational History   Not on file  Tobacco Use   Smoking status: Former    Types: Cigarettes    Passive exposure: Past   Smokeless tobacco: Never  Vaping Use   Vaping status: Never Used  Substance and Sexual Activity   Alcohol use: No  Drug use: No   Sexual activity: Not on file  Other Topics Concern   Not on file  Social History Narrative   Not on file   Social Drivers of Health   Tobacco Use: Medium Risk (06/29/2024)   Patient History    Smoking Tobacco Use: Former    Smokeless Tobacco Use: Never    Passive Exposure: Past  Programmer, Applications: Not on [employer] Insecurity: Not on file  Transportation Needs: Not on file  Physical Activity: Not on file  Stress: Not on  file  Social Connections: Not on file  Intimate Partner Violence: Not on file  Depression (PHQ2-9): Not on file  Alcohol Screen: Not on file  Housing: Not on file  Utilities: Not on file  Health Literacy: Not on file     RELEVANT GI HISTORY, IMAGING AND LABS: CBC    Component Value Date/Time   WBC 5.0 06/28/2023 1545   WBC 6.2 07/09/2018 1345   RBC 4.17 06/28/2023 1545   RBC 3.51 (L) 07/09/2018 1345   HGB 11.8 06/28/2023 1545   HCT 38.2 06/28/2023 1545   PLT 203 06/28/2023 1545   MCV 92 06/28/2023 1545   MCH 28.3 06/28/2023 1545   MCH 22.5 (L) 07/09/2018 1345   MCHC 30.9 (L) 06/28/2023 1545   MCHC 28.2 (L) 07/09/2018 1345   RDW 13.1 06/28/2023 1545   LYMPHSABS 1.6 06/28/2023 1545   MONOABS 0.6 10/28/2016 1907   EOSABS 0.1 06/28/2023 1545   BASOSABS 0.0 06/28/2023 1545   No results for input(s): HGB in the last 8760 hours.  CMP     Component Value Date/Time   NA 141 06/28/2023 1545   K 3.7 06/28/2023 1545   CL 103 06/28/2023 1545   CO2 23 06/28/2023 1545   GLUCOSE 92 06/28/2023 1545   GLUCOSE 91 07/09/2018 1345   BUN 13 06/28/2023 1545   CREATININE 0.71 06/28/2023 1545   CALCIUM  9.2 06/28/2023 1545   PROT 7.3 06/28/2023 1545   ALBUMIN 4.2 06/28/2023 1545   AST 19 06/28/2023 1545   ALT 18 06/28/2023 1545   ALKPHOS 113 06/28/2023 1545   BILITOT 0.4 06/28/2023 1545   GFRNONAA >60 07/09/2018 1345   GFRAA >60 07/09/2018 1345      Latest Ref Rng & Units 06/28/2023    3:45 PM 11/26/2022    2:20 PM 07/09/2018    1:45 PM  Hepatic Function  Total Protein 6.0 - 8.5 g/dL 7.3  7.0  8.0   Albumin 3.9 - 4.9 g/dL 4.2  4.1  3.9   AST 0 - 40 IU/L 19  19  25    ALT 0 - 32 IU/L 18  16  19    Alk Phosphatase 44 - 121 IU/L 113  112  84   Total Bilirubin 0.0 - 1.2 mg/dL 0.4  0.4  0.7       Review of Systems   All systems reviewed and negative except where noted in HPI.    Physical Exam  BP 133/87   Pulse 98   Temp 98 F (36.7 C)   Ht 5' 7.5 (1.715 m)   Wt 251  lb (113.9 kg)   SpO2 93%   BMI 38.73 kg/m  No LMP recorded. Patient is postmenopausal. General:   Alert and oriented. Pleasant and cooperative. Well-nourished and well-developed.  Head:  Normocephalic and atraumatic. Eyes:  Without icterus Ears:  Normal auditory acuity. Neck:  Supple; no masses or thyromegaly. Lungs:  Respirations even and unlabored.  Clear throughout to auscultation.   No wheezes, crackles, or rhonchi. No acute distress. Heart:  Regular rate and rhythm; no murmurs, clicks, rubs, or gallops. Abdomen:  Normal bowel sounds.  No bruits.  Mild TTP epigastric area, non-distended without masses, hepatosplenomegaly or hernias noted.  No guarding or rebound tenderness.   Rectal:  Deferred. Msk:  Symmetrical without gross deformities. Normal posture. Extremities:  Without edema. Neurologic:  Alert and  oriented x4;  grossly normal neurologically. Skin:  Intact without significant lesions or rashes. Psych:  Alert and cooperative. Normal mood and affect.   Assessment & Plan   Renee Gray is a 64 y.o. female presenting today with GERD, hiatal hernia, and constipation.  Hiatal hernia with gastroesophageal reflux disease.  Patient has already been evaluated for surgical repair of large hiatal hernia.  - Refilled omeprazole  40 mg BID. - Educated on lifestyle modifications: avoid carbonated beverages, caffeine, citrus, spicy foods, chocolate, tomatoes, large meals; eat smaller portions, chew well, avoid lying flat post-meal. - Advised weight loss, increased water intake. - Schedule EGD in the near future. I discussed risks of EGD with patient today, including risk of sedation, bleeding or perforation. Patient provides understanding and gave verbal consent to proceed.  Constipation Recommend High Fiber diet with fruits, vegetables, and whole grains. Drink 64 ounces of Fluids Daily. Recommend increasing water and physical activity.  Start Miralax Mix 1 capful in a drink  daily. Start Benefiber Mix 1 TBSP in a drink daily. If no improvement, consider Linzess, Amitiza or Trulance.   Last colonoscopy in 2014, will get patient scheduled for colonoscopy along with EGD.  Follow-up in 3 months  Grayce Bohr, DNP, AGNP-C [facility]  "

## 2024-06-29 ENCOUNTER — Encounter: Payer: Self-pay | Admitting: Family Medicine

## 2024-06-29 ENCOUNTER — Ambulatory Visit: Admitting: Family Medicine

## 2024-06-29 VITALS — BP 133/87 | HR 98 | Temp 98.0°F | Ht 67.5 in | Wt 251.0 lb

## 2024-06-29 DIAGNOSIS — K449 Diaphragmatic hernia without obstruction or gangrene: Secondary | ICD-10-CM | POA: Diagnosis not present

## 2024-06-29 DIAGNOSIS — K219 Gastro-esophageal reflux disease without esophagitis: Secondary | ICD-10-CM | POA: Diagnosis not present

## 2024-06-29 DIAGNOSIS — K5900 Constipation, unspecified: Secondary | ICD-10-CM

## 2024-06-29 DIAGNOSIS — K5909 Other constipation: Secondary | ICD-10-CM

## 2024-06-29 DIAGNOSIS — Z1211 Encounter for screening for malignant neoplasm of colon: Secondary | ICD-10-CM

## 2024-06-29 MED ORDER — OMEPRAZOLE 40 MG PO CPDR: 40.0000 mg | DELAYED_RELEASE_CAPSULE | Freq: Two times a day (BID) | ORAL | 1 refills | Status: AC

## 2024-06-29 MED ORDER — NA SULFATE-K SULFATE-MG SULF 17.5-3.13-1.6 GM/177ML PO SOLN: 1.0000 | Freq: Once | ORAL | 0 refills | Status: AC

## 2024-06-29 NOTE — Patient Instructions (Signed)
" ° °  Start OTC Benefiber Powder. Mix 1 - 2 Tablespoons in 6 - 8 ounces of a Drink Once Daily. Drink 64 ounces of water / fluids Daily.   For constipation: Start OTC Miralax  Powder Mix 1 capful in 6 to 8 ounces of a drink once daily  Recommend high-fiber diet, 30 g of fiber daily Eat fruits, vegetables, and whole grains  "

## 2024-07-26 ENCOUNTER — Ambulatory Visit: Admitting: Surgery

## 2024-08-02 ENCOUNTER — Ambulatory Visit: Admit: 2024-08-02

## 2024-09-27 ENCOUNTER — Ambulatory Visit: Admitting: Family Medicine
